# Patient Record
Sex: Female | Born: 1965
Health system: Southern US, Community
[De-identification: ages and names within clinical notes are randomized; demographics above are authoritative.]

## PROBLEM LIST (undated history)

## (undated) DIAGNOSIS — I10 Essential (primary) hypertension: Secondary | ICD-10-CM

## (undated) HISTORY — DX: Essential (primary) hypertension: I10

---

## 1998-06-09 ENCOUNTER — Other Ambulatory Visit: Admission: RE | Admit: 1998-06-09 | Discharge: 1998-06-09 | Payer: Self-pay | Admitting: Gynecology

## 1999-05-12 ENCOUNTER — Other Ambulatory Visit: Admission: RE | Admit: 1999-05-12 | Discharge: 1999-05-12 | Payer: Self-pay | Admitting: Gynecology

## 2000-08-18 ENCOUNTER — Other Ambulatory Visit: Admission: RE | Admit: 2000-08-18 | Discharge: 2000-08-18 | Payer: Self-pay | Admitting: Gynecology

## 2001-11-08 ENCOUNTER — Other Ambulatory Visit: Admission: RE | Admit: 2001-11-08 | Discharge: 2001-11-08 | Payer: Self-pay | Admitting: Gynecology

## 2002-11-27 ENCOUNTER — Other Ambulatory Visit: Admission: RE | Admit: 2002-11-27 | Discharge: 2002-11-27 | Payer: Self-pay | Admitting: Gynecology

## 2003-12-04 ENCOUNTER — Other Ambulatory Visit: Admission: RE | Admit: 2003-12-04 | Discharge: 2003-12-04 | Payer: Self-pay | Admitting: Gynecology

## 2004-12-07 ENCOUNTER — Other Ambulatory Visit: Admission: RE | Admit: 2004-12-07 | Discharge: 2004-12-07 | Payer: Self-pay | Admitting: Gynecology

## 2004-12-17 HISTORY — PX: TUBAL LIGATION: SHX77

## 2004-12-17 HISTORY — PX: PELVIC LAPAROSCOPY: SHX162

## 2005-01-14 ENCOUNTER — Ambulatory Visit (HOSPITAL_BASED_OUTPATIENT_CLINIC_OR_DEPARTMENT_OTHER): Admission: RE | Admit: 2005-01-14 | Discharge: 2005-01-14 | Payer: Self-pay | Admitting: Gynecology

## 2005-01-14 ENCOUNTER — Encounter (INDEPENDENT_AMBULATORY_CARE_PROVIDER_SITE_OTHER): Payer: Self-pay | Admitting: *Deleted

## 2005-01-14 ENCOUNTER — Ambulatory Visit (HOSPITAL_COMMUNITY): Admission: RE | Admit: 2005-01-14 | Discharge: 2005-01-14 | Payer: Self-pay | Admitting: Gynecology

## 2005-12-21 ENCOUNTER — Other Ambulatory Visit: Admission: RE | Admit: 2005-12-21 | Discharge: 2005-12-21 | Payer: Self-pay | Admitting: Gynecology

## 2006-12-23 ENCOUNTER — Other Ambulatory Visit: Admission: RE | Admit: 2006-12-23 | Discharge: 2006-12-23 | Payer: Self-pay | Admitting: Gynecology

## 2008-01-09 ENCOUNTER — Other Ambulatory Visit: Admission: RE | Admit: 2008-01-09 | Discharge: 2008-01-09 | Payer: Self-pay | Admitting: Gynecology

## 2009-01-13 ENCOUNTER — Ambulatory Visit: Payer: Self-pay | Admitting: Gynecology

## 2009-01-13 ENCOUNTER — Other Ambulatory Visit: Admission: RE | Admit: 2009-01-13 | Discharge: 2009-01-13 | Payer: Self-pay | Admitting: Gynecology

## 2009-01-13 ENCOUNTER — Encounter: Payer: Self-pay | Admitting: Gynecology

## 2010-01-30 ENCOUNTER — Other Ambulatory Visit: Admission: RE | Admit: 2010-01-30 | Discharge: 2010-01-30 | Payer: Self-pay | Admitting: Gynecology

## 2010-01-30 ENCOUNTER — Ambulatory Visit: Payer: Self-pay | Admitting: Gynecology

## 2010-12-04 NOTE — H&P (Signed)
NAMEMARAKI, Kelsey Nolan             ACCOUNT NO.:  [id number]   MEDICAL RECORD NO.:  [id number]          PATIENT TYPE:  AMB   LOCATION:  [facility]                         FACILITY:  [facility]   PHYSICIAN:  Timothy P. Fontaine, M.D.DATE OF BIRTH:  [birth year]   DATE OF ADMISSION:  01/14/2005  DATE OF DISCHARGE:                                HISTORY & PHYSICAL   Patient is being admitted on January 14, 2005 at 7:30 at [facility] for day surgery.   CHIEF COMPLAINT:  Increasing dysmenorrhea.  Desires permanent sterilization.   HISTORY OF PRESENT ILLNESS:  A 45 year old G0 presents with a history of  increasing dysmenorrhea.  The patient has a history of being on oral  contraceptives but was unable to tolerate them secondary to side effects and  notes that she has been experiencing increasing dysmenorrhea during her  menses, which is becoming disabling to her.  The patient also desires no  pregnancies and wants to proceed with tubal sterilization.  The patient has  a normal physical exam, and an ultrasound which is normal, showing a normal  uterus, right and left ovaries.  The patient is admitted at this time for  diagnostic laparoscopy and bilateral tubal sterilization.   PAST MEDICAL HISTORY:  Uncomplicated.   PAST SURGICAL HISTORY:  None.   ALLERGIES:  None.   CURRENT MEDICATIONS:  Allegra, multivitamins, and iron.   REVIEW OF SYSTEMS:  Noncontributory.   SOCIAL HISTORY:  Noncontributory.   FAMILY HISTORY:  Noncontributory.   ADMISSION PHYSICAL EXAMINATION:  VITAL SIGNS:  Afebrile.  Vital signs are  stable.  HEENT:  Normal.  LUNGS:  Clear.  CARDIAC:  Regular rate without murmurs, rubs or gallops.  ABDOMEN:  Benign.  PELVIC:  External BUS, vagina normal.  Cervix normal.  Uterus normal size,  midline, and mobile.  Nontender.  Adnexa without masses or tenderness.   ASSESSMENT/PLAN:  A 45 year old G0 with increasing dysmenorrhea, suspicious  for endometriosis.  Normal  physical exam and ultrasound, who also desires  permanent sterilization.  Patient is admitted for a diagnostic laparoscopy  and tubal sterilization.  The risks, benefits, indications, and alternatives  were reviewed with the patient.  The expected intraoperative and  postoperative course and the intraoperative and postoperative risks were all  discussed, understood and accepted.  The pathologic possibilities were  reviewed with the patient to include possible endometriosis, pelvic adhesive  disease, as well as the possibility of a normal pelvis.  She understands  that we may or may not find disease.  I may be able to eradicate all visible  abnormalities, or it may leave her with active disease, all of which she  understands and accepts.  The patient also understands there are no  guarantees as far as pain relief and that her pain may persist, worsen, or  change following the procedure.  I also discussed sterilization with her.  She understands that this is a permanent procedure but also understands this  does have a risk of failure, and she understands and accepts these issues.  Insufflation, trocar placement, use of blunt/sharp dissection,  electrocautery, and laser  were all reviewed with her.  Falope ring  application as well as bipolar cautery sterilization was also discussed with  her.  The risks of infection, both internal, requiring prolonged  antibiotics, as well as incisional, requiring opening and draining of  incisions and closure by secondary intention, was all discussed, understood,  and accepted.  The risk of vascular injury leading to hemorrhage and the  risk of hemorrhage to include transfusion was all discussed, including  transfusion reaction, hepatitis, HIV, mad cow disease, and other unknown  entities.  The risk of inadvertant injury to internal organs, including  bowel, bladder, ureters, vessels, and nerves, necessitating major reparative  surgeries, and future  reparative surgeries, including ostomy formation, was  all discussed, understood, and accepted.  The issues of immediate  recognition as well as delayed recognition was also discussed with her.  The  patient's questions were answered to her satisfaction.  She is ready to  proceed with surgery.       TPF/MEDQ  D:  01/12/2005  T:  01/12/2005  Job:  [id number]

## 2010-12-04 NOTE — Op Note (Signed)
Kelsey Nolan, Kelsey Nolan             ACCOUNT NO.:  [id number]   MEDICAL RECORD NO.:  [id number]          PATIENT TYPE:  AMB   LOCATION:  [facility]                         FACILITY:  [facility]   PHYSICIAN:  Timothy P. Fontaine, M.D.DATE OF BIRTH:  1966-01-19   DATE OF PROCEDURE:  01/14/2005  DATE OF DISCHARGE:                                 OPERATIVE REPORT   PREOPERATIVE DIAGNOSES:  Increasing dysmenorrhea, desires permanent  sterilization.   POSTOPERATIVE DIAGNOSES:  1.  Sterilization, Falope ring technique.  2.  Endometriosis.  3.  Leiomyomata.   PROCEDURE:  Laparoscopic biopsy fulguration endometriosis, bilateral tubal  sterilization, Falope ring technique.   SURGEON:  Dr. Audie Box.   ANESTHETIC:  General.   ESTIMATED BLOOD LOSS:  Minimal.   COMPLICATIONS:  None.   SPECIMEN:  Peritoneal biopsy.   FINDINGS:  Anterior cul-de-sac normal. Posterior cul-de-sac with multiple  superficial endometriotic implants; red, shaggy, powder burn in appearance.  Right uterosacral with classic powder burn implant.  Mid ligament lateral to  uterosacral ligament inferior to ureter:  Small white endometriotic implant.  Right pelvic sidewall otherwise normal. Left pelvic side wall with several  small white endometriotic implants. Uterus normal size, shape and contour.  Two small 5 mm leiomyomata right fundus, subserosal. Right and left  fallopian tubes normal length, caliber fimbriated ends. Single Falope ring  applied to each tube.  Left ovary grossly normal, free and mobile. Right  ovary with several endometriotic implants on the capsule. A 2 cm cyst noted.  Aspiration showed clear cyst fluid consistent with a physiologic cyst. Upper  abdominal exam was grossly normal without evidence of adhesive disease.  Appendix was visualized, normal, free and mobile. Liver edge was visualized  which was normal.  Gallbladder tip was visualized which was normal. They  represented a sample of the endometriosis,  was biopsied and all visible  areas were bipolar cauterized.   PROCEDURE:  The patient was taken to the operating room, underwent general  endotracheal anesthesia, was placed in the low dorsal lithotomy position,  received an abdominal peroneal vaginal preparation with Betadine solution.  The bladder was emptied with in-and-out Foley catheterization. EUA  performed.  The Hulka tenaculum placed through the cervix without  difficulty. The patient was draped in the usual fashion. A vertical  infraumbilical incision was made.  The Veress needle placed, its position  verified with water and approximately 3 liters of carbon dioxide gas were  infused without difficulty. The 10 mm laparoscopic trocar was then placed  without difficulties, its position verified visually. The suprapubic Falope  ring applying trocar was then placed in the midline without difficulty after  transillumination for the vessels under direct visualization. Examination of  the pelvic organs, upper abdominal exam was then carried out with findings  noted above. A representative biopsy of the endometriosis along the right  uterosacral ligament was taken and subsequently all visible areas of  endometriosis were bipolar cauterized either in a brushing technique or  within the grasp of the bipolar forceps. The ureters were identified on both  sides and were away from the cautery sites. Due to the small  cyst on the  right ovary and for fear of an endometrioma, this area was entered and clear  fluid was noted consistent with a physiologic cyst. The pelvis was irrigated  and evacuated noting adequate hemostasis and no remaining areas of active-  appearing endometriosis. Attention was then turned to the tubal  sterilization. The right fallopian tube was identified, traced from its  insertion to its fimbriated end. A mid tubal segment was then drawn into the  Falope ring applier and a single Falope ring was applied. A good knuckle  of  tube was within the ring. Appropriate blanching occurred afterwards and  manipulation assured secure placement. A similar procedure was carried out  on the other side. The suprapubic port was then removed, gas allowed to  escape. All areas re-inspected under low pressure situation showing adequate  hemostasis and the infraumbilical port was backed out under direct  visualization showing adequate hemostasis and no evidence of hernia  formation. A 0 Vicryl infraumbilical subcutaneous fascial stitch was placed.  Both port sites were injected using 0.25% Marcaine and both port sites were  closed using Dermabond skin adhesive. The Hulka tenaculum was removed. The  patient placed in the supine position, awakened without difficulty and taken  to the recovery in good condition having tolerated the procedure well.       TPF/MEDQ  D:  01/14/2005  T:  01/14/2005  Job:  [id number]

## 2011-02-05 ENCOUNTER — Encounter: Payer: Self-pay | Admitting: Gynecology

## 2011-02-18 ENCOUNTER — Encounter: Payer: Self-pay | Admitting: Gynecology

## 2011-02-18 ENCOUNTER — Ambulatory Visit (INDEPENDENT_AMBULATORY_CARE_PROVIDER_SITE_OTHER): Payer: BC Managed Care – PPO | Admitting: Gynecology

## 2011-02-18 ENCOUNTER — Other Ambulatory Visit (HOSPITAL_COMMUNITY)
Admission: RE | Admit: 2011-02-18 | Discharge: 2011-02-18 | Disposition: A | Discharge status: Home / Self Care | Payer: BC Managed Care – PPO | Source: Ambulatory Visit | Attending: Gynecology | Admitting: Gynecology

## 2011-02-18 VITALS — BP 116/70 | Ht 62.75 in | Wt 137.0 lb

## 2011-02-18 DIAGNOSIS — R823 Hemoglobinuria: Secondary | ICD-10-CM

## 2011-02-18 DIAGNOSIS — Z01419 Encounter for gynecological examination (general) (routine) without abnormal findings: Secondary | ICD-10-CM

## 2011-02-18 DIAGNOSIS — Z13220 Encounter for screening for lipoid disorders: Secondary | ICD-10-CM

## 2011-02-18 DIAGNOSIS — Z131 Encounter for screening for diabetes mellitus: Secondary | ICD-10-CM

## 2011-02-18 NOTE — Progress Notes (Signed)
Kelsey Nolan 12-12-1965 [id number]   History:    45 y.o.  for annual exam.  Doing well status post a BTL having regular menses. She does note some abdominal bloating comes and goes and some constipation having bowel movements up to every 3 days. She's tried probiotics it makes her feel a bit better she is no nausea vomiting diarrhea.  Past medical history,surgical history, family history and social history were all reviewed and documented in the EPIC chart. ROS:  Was performed and pertinent positives and negatives are included in the history.  Exam: chaperone present Filed Vitals:   02/18/11 1502  BP: 116/70   General appearance  Normal Skin grossly normal Head/Neck normal with no cervical or supraclavicular adenopathy thyroid normal Lungs  clear Cardiac RR, without RMG Abdominal  soft, nontender, without masses, guarding, rebound or organomegaly  Breasts  examined lying and sitting without masses, retractions, discharge or axillary adenopathy. Pelvic  Ext/BUS/vagina  normal   Cervix  normal Pap done  Uterus  anteverted, normal size, shape and contour, midline and mobile nontender   Adnexa  Without masses or tenderness  Anus and perineum  normal   Rectovaginal  normal sphincter tone without palpated masses or tenderness.   Assessment/Plan:  45 y.o. female for annual exam .  Doing well having some GI type symptoms suggestive of irritable bowel with bloating and irregular bowel movements. She tried probiotics it seemed to help somewhat I asked her to increase her fiber daily since this doesn't help. If her symptoms persist she is to call me on referred to gastroenterology she agrees to do so.   From a GYN standpoint she is doing well regular menses status post BTL. SBE reviewed just had mammo-in June willl continue with annual mammography. CBC glucose lipid profile urinalysis were ordered. Patient will see me year sooner as needed    Dara Lords MD, 3:25 PM 02/18/2011

## 2012-01-17 ENCOUNTER — Other Ambulatory Visit: Payer: Self-pay

## 2012-01-17 DIAGNOSIS — R9230 Dense breasts, unspecified: Secondary | ICD-10-CM

## 2012-01-17 DIAGNOSIS — R922 Inconclusive mammogram: Secondary | ICD-10-CM

## 2012-01-18 ENCOUNTER — Other Ambulatory Visit: Payer: Self-pay | Admitting: Gynecology

## 2012-01-18 DIAGNOSIS — R922 Inconclusive mammogram: Secondary | ICD-10-CM

## 2012-01-21 ENCOUNTER — Encounter: Payer: Self-pay | Admitting: Gynecology

## 2012-02-21 ENCOUNTER — Encounter: Payer: Self-pay | Admitting: Gynecology

## 2012-02-21 ENCOUNTER — Ambulatory Visit (INDEPENDENT_AMBULATORY_CARE_PROVIDER_SITE_OTHER): Payer: BC Managed Care – PPO | Admitting: Gynecology

## 2012-02-21 VITALS — BP 112/70 | Ht 62.5 in | Wt 135.0 lb

## 2012-02-21 DIAGNOSIS — Z01419 Encounter for gynecological examination (general) (routine) without abnormal findings: Secondary | ICD-10-CM

## 2012-02-21 DIAGNOSIS — Z131 Encounter for screening for diabetes mellitus: Secondary | ICD-10-CM

## 2012-02-21 LAB — CBC WITH DIFFERENTIAL/PLATELET
Hemoglobin: 12.9 g/dL (ref 12.0–15.0)
Lymphocytes Relative: 23 % (ref 12–46)
Lymphs Abs: 2.1 10*3/uL (ref 0.7–4.0)
MCV: 89.5 fL (ref 78.0–100.0)
Monocytes Relative: 4 % (ref 3–12)
Neutrophils Relative %: 72 % (ref 43–77)
Platelets: 273 10*3/uL (ref 150–400)
RBC: 4.4 MIL/uL (ref 3.87–5.11)
WBC: 9 10*3/uL (ref 4.0–10.5)

## 2012-02-21 NOTE — Patient Instructions (Signed)
Follow up in one year for annual gynecologic exam. 

## 2012-02-21 NOTE — Progress Notes (Signed)
Kelsey Nolan 1966/01/22 [id number]        46 y.o.  G0P0 for annual exam.  Doing well no complaints.  Past medical history,surgical history, medications, allergies, family history and social history were all reviewed and documented in the EPIC chart. ROS:  Was performed and pertinent positives and negatives are included in the history.  Exam: Sherrilyn Rist assistant Filed Vitals:   02/21/12 1503  BP: 112/70  Height: 5' 2.5" (1.588 m)  Weight: 135 lb (61.236 kg)   General appearance  Normal Skin grossly normal Head/Neck normal with no cervical or supraclavicular adenopathy thyroid normal Lungs  clear Cardiac RR, without RMG Abdominal  soft, nontender, without masses, organomegaly or hernia Breasts  examined lying and sitting without masses, retractions, discharge or axillary adenopathy. Pelvic  Ext/BUS/vagina  normal   Cervix  normal   Uterus  anteverted, normal size, shape and contour, midline and mobile nontender   Adnexa  Without masses or tenderness    Anus and perineum  normal   Rectovaginal  normal sphincter tone without palpated masses or tenderness.    Assessment/Plan:  46 y.o. G0P0 female for annual exam, BTL, regular menses.   1. Mammography. Mammogram July 2013. We'll continue with annual mammography. SBE monthly reviewed. 2. Pap smear. Pap 2012 normal. No Pap smear done today. No history of abnormal Pap smears. Plan every 3-5 year screening per current screening guidelines. 3. Health maintenance. Lipid profile last year normal. We'll check CBC glucose urinalysis. Follow up in one year, sooner as needed.    Dara Lords MD, 3:28 PM 02/21/2012

## 2012-02-22 LAB — URINALYSIS W MICROSCOPIC + REFLEX CULTURE
Casts: NONE SEEN
Crystals: NONE SEEN
Leukocytes, UA: NEGATIVE
Nitrite: NEGATIVE
Specific Gravity, Urine: 1.008 (ref 1.005–1.030)
Squamous Epithelial / LPF: NONE SEEN
Urobilinogen, UA: 0.2 mg/dL (ref 0.0–1.0)
pH: 5.5 (ref 5.0–8.0)

## 2012-02-22 LAB — GLUCOSE, RANDOM: Glucose, Bld: 96 mg/dL (ref 70–99)

## 2013-01-29 ENCOUNTER — Encounter: Payer: Self-pay | Admitting: Gynecology

## 2013-02-27 ENCOUNTER — Ambulatory Visit (INDEPENDENT_AMBULATORY_CARE_PROVIDER_SITE_OTHER): Payer: BC Managed Care – PPO | Admitting: Gynecology

## 2013-02-27 ENCOUNTER — Encounter: Payer: Self-pay | Admitting: Gynecology

## 2013-02-27 VITALS — BP 122/76 | Ht 62.0 in | Wt 137.0 lb

## 2013-02-27 DIAGNOSIS — Z01419 Encounter for gynecological examination (general) (routine) without abnormal findings: Secondary | ICD-10-CM

## 2013-02-27 DIAGNOSIS — Z13220 Encounter for screening for lipoid disorders: Secondary | ICD-10-CM

## 2013-02-27 LAB — CBC WITH DIFFERENTIAL/PLATELET
Basophils Relative: 0 % (ref 0–1)
Eosinophils Absolute: 0.1 10*3/uL (ref 0.0–0.7)
Eosinophils Relative: 1 % (ref 0–5)
HCT: 37.6 % (ref 36.0–46.0)
Hemoglobin: 12.9 g/dL (ref 12.0–15.0)
MCH: 29.8 pg (ref 26.0–34.0)
MCHC: 34.3 g/dL (ref 30.0–36.0)
MCV: 86.8 fL (ref 78.0–100.0)
Monocytes Absolute: 0.5 10*3/uL (ref 0.1–1.0)
Monocytes Relative: 7 % (ref 3–12)
Neutro Abs: 4.2 10*3/uL (ref 1.7–7.7)

## 2013-02-27 LAB — COMPREHENSIVE METABOLIC PANEL
Albumin: 4.7 g/dL (ref 3.5–5.2)
Alkaline Phosphatase: 37 U/L — ABNORMAL LOW (ref 39–117)
BUN: 12 mg/dL (ref 6–23)
Creat: 0.93 mg/dL (ref 0.50–1.10)
Glucose, Bld: 84 mg/dL (ref 70–99)
Potassium: 4 mEq/L (ref 3.5–5.3)

## 2013-02-27 LAB — LIPID PANEL
HDL: 48 mg/dL (ref >39)
LDL Cholesterol: 106 mg/dL — ABNORMAL HIGH (ref 0–99)
Triglycerides: 121 mg/dL (ref <150)

## 2013-02-27 NOTE — Progress Notes (Signed)
Kelsey Nolan 09-15-1965 [id number]        47 y.o.  G0P0 for annual exam.  Doing well without complaints.  Past medical history,surgical history, medications, allergies, family history and social history were all reviewed and documented in the EPIC chart.  ROS:  Performed and pertinent positives and negatives are included in the history, assessment and plan .  Exam: [facility] assistant Filed Vitals:   02/27/13 1544  BP: 122/76  Height: 5\' 2"  (1.575 m)  Weight: 137 lb (62.143 kg)   General appearance  Normal Skin grossly normal Head/Neck normal with no cervical or supraclavicular adenopathy thyroid normal Lungs  clear Cardiac RR, without RMG Abdominal  soft, nontender, without masses, organomegaly or hernia Breasts  examined lying and sitting without masses, retractions, discharge or axillary adenopathy. Pelvic  Ext/BUS/vagina  normal   Cervix  normal   Uterus  anteverted, normal size, shape and contour, midline and mobile nontender   Adnexa  Without masses or tenderness    Anus and perineum  normal   Rectovaginal  normal sphincter tone without palpated masses or tenderness.    Assessment/Plan:  47 y.o. G0P0 female for annual exam, regular menses, tubal sterilization.   1. Pap smear 2012. No Pap smear done today. No history of abnormal Pap smears previously. Plan repeat Pap smear next year 3 year interval. 2. Mammography 01/2013. Continued annual mammography. SBE monthly reviewed. 3. Health maintenance. Baseline CBC comprehensive metabolic panel lipid profile urinalysis ordered. Followup one year, sooner as needed.  Note: This document was prepared with digital dictation and possible smart phrase technology. Any transcriptional errors that result from this process are unintentional.   Dara Lords MD, 4:08 PM 02/27/2013

## 2013-02-27 NOTE — Patient Instructions (Signed)
followup in one year, sooner as needed. 

## 2013-02-28 LAB — URINALYSIS W MICROSCOPIC + REFLEX CULTURE
Bacteria, UA: NONE SEEN
Casts: NONE SEEN
Glucose, UA: NEGATIVE mg/dL
Hgb urine dipstick: NEGATIVE
Ketones, ur: NEGATIVE mg/dL
Nitrite: NEGATIVE
pH: 7.5 (ref 5.0–8.0)

## 2013-05-24 ENCOUNTER — Other Ambulatory Visit: Payer: Self-pay

## 2014-02-01 ENCOUNTER — Encounter: Payer: Self-pay | Admitting: Gynecology

## 2014-03-04 ENCOUNTER — Encounter: Payer: BC Managed Care – PPO | Admitting: Gynecology

## 2014-03-06 ENCOUNTER — Encounter: Payer: Self-pay | Admitting: Gynecology

## 2014-03-06 ENCOUNTER — Other Ambulatory Visit (HOSPITAL_COMMUNITY)
Admission: RE | Admit: 2014-03-06 | Discharge: 2014-03-06 | Disposition: A | Discharge status: Home / Self Care | Payer: BC Managed Care – PPO | Source: Ambulatory Visit | Attending: Gynecology | Admitting: Gynecology

## 2014-03-06 ENCOUNTER — Ambulatory Visit (INDEPENDENT_AMBULATORY_CARE_PROVIDER_SITE_OTHER): Payer: BC Managed Care – PPO | Admitting: Gynecology

## 2014-03-06 VITALS — BP 112/64 | Ht 62.0 in | Wt 136.0 lb

## 2014-03-06 DIAGNOSIS — Z1151 Encounter for screening for human papillomavirus (HPV): Secondary | ICD-10-CM | POA: Insufficient documentation

## 2014-03-06 DIAGNOSIS — Z01419 Encounter for gynecological examination (general) (routine) without abnormal findings: Secondary | ICD-10-CM | POA: Diagnosis present

## 2014-03-06 LAB — CBC WITH DIFFERENTIAL/PLATELET
BASOS PCT: 1 % (ref 0–1)
Basophils Absolute: 0.1 10*3/uL (ref 0.0–0.1)
EOS ABS: 0.1 10*3/uL (ref 0.0–0.7)
EOS PCT: 1 % (ref 0–5)
HEMATOCRIT: 37.8 % (ref 36.0–46.0)
HEMOGLOBIN: 13 g/dL (ref 12.0–15.0)
LYMPHS ABS: 2.1 10*3/uL (ref 0.7–4.0)
Lymphocytes Relative: 25 % (ref 12–46)
MCH: 30.2 pg (ref 26.0–34.0)
MCHC: 34.4 g/dL (ref 30.0–36.0)
MCV: 87.9 fL (ref 78.0–100.0)
MONO ABS: 0.6 10*3/uL (ref 0.1–1.0)
MONOS PCT: 7 % (ref 3–12)
NEUTROS PCT: 66 % (ref 43–77)
Neutro Abs: 5.5 10*3/uL (ref 1.7–7.7)
Platelets: 286 10*3/uL (ref 150–400)
RBC: 4.3 MIL/uL (ref 3.87–5.11)
RDW: 13.6 % (ref 11.5–15.5)
WBC: 8.4 10*3/uL (ref 4.0–10.5)

## 2014-03-06 NOTE — Progress Notes (Signed)
Kelsey BusmanKaren Nolan [birth year] [id number]        48 y.o.  G0P0 for annual exam.  Doing well without complaints.  Past medical history,surgical history, problem list, medications, allergies, family history and social history were all reviewed and documented as reviewed in the EPIC chart.  ROS:  12 system ROS performed with pertinent positives and negatives included in the history, assessment and plan.   Additional significant findings :  None   Exam: [facility] personassistant Filed Vitals:   03/06/14 1551  BP: 112/64  Height: 5\' 2"  (1.575 m)  Weight: 136 lb (61.689 kg)   General appearance:  Normal affect, orientation and appearance. Skin: Grossly normal HEENT: Without gross lesions.  No cervical or supraclavicular adenopathy. Thyroid normal.  Lungs:  Clear without wheezing, rales or rhonchi Cardiac: RR, without RMG Abdominal:  Soft, nontender, without masses, guarding, rebound, organomegaly or hernia Breasts:  Examined lying and sitting without masses, retractions, discharge or axillary adenopathy. Pelvic:  Ext/BUS/vagina normal  Cervix normal. Pap/HPV  Uterus anteverted, normal size, shape and contour, midline and mobile nontender   Adnexa  Without masses or tenderness    Anus and perineum  Normal   Rectovaginal  Normal sphincter tone without palpated masses or tenderness.    Assessment/Plan:  48 y.o. G0P0 female for annual exam with regular menses, tubal sterilization.   1. Pap smear 2012. Pap/HPV done today. No history of abnormal Pap smears previously. Plan followup Pap smear in 3-5 your interval per current screening guidelines assuming this Pap smear is normal. 2. 3-D Mammography 01/2014. Both the patient's mother and maternal grandmother had postmenopausal breast cancer. No other family members with breast cancer or ovarian cancer. Discussed possible genetic linkage. Her mother is alive and I asked her to have her mother asked about genetic testing with her oncologist and whether they  would recommend this and she agrees to discuss this with her. I reviewed that it would be best to have the patient with the cancer tested. Will continue with annual mammography. SBE monthly reviewed. 3. Health maintenance. Baseline CBC comprehensive metabolic panel lipid profile urinalysis ordered. Followup in one year, sooner as needed.   Note: This document was prepared with digital dictation and possible smart phrase technology. Any transcriptional errors that result from this process are unintentional.   Dara LordsFONTAINE,Kelsey Little P MD, 4:39 PM 03/06/2014

## 2014-03-07 ENCOUNTER — Other Ambulatory Visit: Payer: Self-pay | Admitting: Gynecology

## 2014-03-07 DIAGNOSIS — R7309 Other abnormal glucose: Secondary | ICD-10-CM

## 2014-03-07 LAB — COMPREHENSIVE METABOLIC PANEL
ALBUMIN: 4.6 g/dL (ref 3.5–5.2)
ALT: 16 U/L (ref 0–35)
AST: 17 U/L (ref 0–37)
Alkaline Phosphatase: 37 U/L — ABNORMAL LOW (ref 39–117)
BILIRUBIN TOTAL: 0.5 mg/dL (ref 0.2–1.2)
BUN: 9 mg/dL (ref 6–23)
CALCIUM: 9.2 mg/dL (ref 8.4–10.5)
CO2: 28 mEq/L (ref 19–32)
CREATININE: 0.78 mg/dL (ref 0.50–1.10)
Chloride: 100 mEq/L (ref 96–112)
Glucose, Bld: 108 mg/dL — ABNORMAL HIGH (ref 70–99)
Potassium: 3.8 mEq/L (ref 3.5–5.3)
Sodium: 138 mEq/L (ref 135–145)
TOTAL PROTEIN: 7 g/dL (ref 6.0–8.3)

## 2014-03-07 LAB — URINALYSIS W MICROSCOPIC + REFLEX CULTURE
BACTERIA UA: NONE SEEN
BILIRUBIN URINE: NEGATIVE
CASTS: NONE SEEN
CRYSTALS: NONE SEEN
GLUCOSE, UA: NEGATIVE mg/dL
Hgb urine dipstick: NEGATIVE
KETONES UR: NEGATIVE mg/dL
Leukocytes, UA: NEGATIVE
Nitrite: NEGATIVE
Protein, ur: NEGATIVE mg/dL
Squamous Epithelial / LPF: NONE SEEN
Urobilinogen, UA: 0.2 mg/dL (ref 0.0–1.0)
pH: 7 (ref 5.0–8.0)

## 2014-03-07 LAB — LIPID PANEL
CHOL/HDL RATIO: 3.3 ratio
CHOLESTEROL: 178 mg/dL (ref 0–200)
HDL: 54 mg/dL (ref >39)
LDL CALC: 84 mg/dL (ref 0–99)
Triglycerides: 201 mg/dL — ABNORMAL HIGH (ref <150)
VLDL: 40 mg/dL (ref 0–40)

## 2014-03-08 LAB — CYTOLOGY - PAP

## 2014-03-11 ENCOUNTER — Other Ambulatory Visit: Payer: BC Managed Care – PPO

## 2014-03-11 DIAGNOSIS — R7309 Other abnormal glucose: Secondary | ICD-10-CM

## 2014-03-11 LAB — GLUCOSE, FASTING: GLUCOSE, FASTING: 114 mg/dL — AB (ref 70–99)

## 2014-03-12 ENCOUNTER — Other Ambulatory Visit: Payer: Self-pay | Admitting: Gynecology

## 2014-03-12 DIAGNOSIS — R7309 Other abnormal glucose: Secondary | ICD-10-CM

## 2014-07-29 ENCOUNTER — Other Ambulatory Visit: Payer: BLUE CROSS/BLUE SHIELD

## 2014-07-29 DIAGNOSIS — R7309 Other abnormal glucose: Secondary | ICD-10-CM

## 2014-07-29 LAB — HEMOGLOBIN A1C
HEMOGLOBIN A1C: 5.8 % — AB (ref <5.7)
Mean Plasma Glucose: 120 mg/dL — ABNORMAL HIGH (ref <117)

## 2014-07-30 ENCOUNTER — Other Ambulatory Visit: Payer: Self-pay | Admitting: Gynecology

## 2014-07-30 DIAGNOSIS — R7309 Other abnormal glucose: Secondary | ICD-10-CM

## 2014-07-30 LAB — GLUCOSE, FASTING: GLUCOSE, FASTING: 100 mg/dL — AB (ref 70–99)

## 2015-02-06 ENCOUNTER — Encounter: Payer: Self-pay | Admitting: Gynecology

## 2015-03-20 ENCOUNTER — Ambulatory Visit (INDEPENDENT_AMBULATORY_CARE_PROVIDER_SITE_OTHER): Payer: BLUE CROSS/BLUE SHIELD | Admitting: Gynecology

## 2015-03-20 ENCOUNTER — Encounter: Payer: Self-pay | Admitting: Gynecology

## 2015-03-20 VITALS — BP 120/76 | Ht 62.0 in | Wt 115.0 lb

## 2015-03-20 DIAGNOSIS — Z01419 Encounter for gynecological examination (general) (routine) without abnormal findings: Secondary | ICD-10-CM

## 2015-03-20 LAB — CBC WITH DIFFERENTIAL/PLATELET
Basophils Absolute: 0.1 10*3/uL (ref 0.0–0.1)
Basophils Relative: 1 % (ref 0–1)
EOS PCT: 1 % (ref 0–5)
Eosinophils Absolute: 0.1 10*3/uL (ref 0.0–0.7)
HCT: 36.6 % (ref 36.0–46.0)
Hemoglobin: 12.4 g/dL (ref 12.0–15.0)
LYMPHS PCT: 29 % (ref 12–46)
Lymphs Abs: 1.8 10*3/uL (ref 0.7–4.0)
MCH: 30.6 pg (ref 26.0–34.0)
MCHC: 33.9 g/dL (ref 30.0–36.0)
MCV: 90.4 fL (ref 78.0–100.0)
MPV: 9.3 fL (ref 8.6–12.4)
Monocytes Absolute: 0.4 10*3/uL (ref 0.1–1.0)
Monocytes Relative: 6 % (ref 3–12)
NEUTROS PCT: 63 % (ref 43–77)
Neutro Abs: 4 10*3/uL (ref 1.7–7.7)
PLATELETS: 251 10*3/uL (ref 150–400)
RBC: 4.05 MIL/uL (ref 3.87–5.11)
RDW: 13.5 % (ref 11.5–15.5)
WBC: 6.3 10*3/uL (ref 4.0–10.5)

## 2015-03-20 LAB — LIPID PANEL
Cholesterol: 178 mg/dL (ref 125–200)
HDL: 60 mg/dL (ref >=46)
LDL Cholesterol: 103 mg/dL (ref <130)
Total CHOL/HDL Ratio: 3 Ratio (ref <=5.0)
Triglycerides: 76 mg/dL (ref <150)
VLDL: 15 mg/dL (ref <30)

## 2015-03-20 LAB — COMPREHENSIVE METABOLIC PANEL
ALK PHOS: 30 U/L — AB (ref 33–115)
ALT: 22 U/L (ref 6–29)
AST: 22 U/L (ref 10–35)
Albumin: 4.7 g/dL (ref 3.6–5.1)
BUN: 19 mg/dL (ref 7–25)
CO2: 31 mmol/L (ref 20–31)
Calcium: 9.1 mg/dL (ref 8.6–10.2)
Chloride: 99 mmol/L (ref 98–110)
Creat: 1.02 mg/dL (ref 0.50–1.10)
GLUCOSE: 79 mg/dL (ref 65–99)
Potassium: 3.8 mmol/L (ref 3.5–5.3)
Sodium: 140 mmol/L (ref 135–146)
TOTAL PROTEIN: 7 g/dL (ref 6.1–8.1)
Total Bilirubin: 0.4 mg/dL (ref 0.2–1.2)

## 2015-03-20 LAB — TSH: TSH: 4.071 u[IU]/mL (ref 0.350–4.500)

## 2015-03-20 NOTE — Progress Notes (Signed)
Kelsey Nolan Feb 03, 1966 [id number]        49 y.o.  G0P0 for annual exam. Doing well without complaints.  Past medical history,surgical history, problem list, medications, allergies, family history and social history were all reviewed and documented as reviewed in the EPIC chart.  ROS:  Performed with pertinent positives and negatives included in the history, assessment and plan.   Additional significant findings :  none   Exam: [facility] person Vitals:   03/20/15 1524  BP: 120/76  Height:  (1.575 m)  Weight: 115 lb (52.164 kg)   General appearance:  Normal affect, orientation and appearance. Skin: Grossly normal HEENT: Without gross lesions.  No cervical or supraclavicular adenopathy. Thyroid normal.  Lungs:  Clear without wheezing, rales or rhonchi Cardiac: RR, without RMG Abdominal:  Soft, nontender, without masses, guarding, rebound, organomegaly or hernia Breasts:  Examined lying and sitting without masses, retractions, discharge or axillary adenopathy. Pelvic:  Ext/BUS/vagina normal  Cervix normal  Uterus anteverted, normal size, shape and contour, midline and mobile nontender   Adnexa  Without masses or tenderness    Anus and perineum  Normal   Rectovaginal  Normal sphincter tone without palpated masses or tenderness.    Assessment/Plan:  49 y.o. G0P0 female for annual exam with regular menses, tubal sterilization.   1. Pap smear/HPV negative 02/2014. No Pap smear done today. No history of significant abnormal Pap smears. Plan repeat Pap smear in 3-5 year interval. 2. Mammography 01/2015. Continue with annual mammography. SBE monthly reviewed. 3. Health maintenance. Baseline CBC comprehensive metabolic panel lipid profile urinalysis TSH vitamin D ordered.  Follow up in one year, sooner as needed   Dara Lords MD, 3:55 PM 03/20/2015

## 2015-03-20 NOTE — Patient Instructions (Signed)

## 2015-03-21 LAB — URINALYSIS W MICROSCOPIC + REFLEX CULTURE
Bacteria, UA: NONE SEEN [HPF]
Bilirubin Urine: NEGATIVE
CRYSTALS: NONE SEEN [HPF]
Casts: NONE SEEN [LPF]
Glucose, UA: NEGATIVE
Hgb urine dipstick: NEGATIVE
KETONES UR: NEGATIVE
Leukocytes, UA: NEGATIVE
Nitrite: NEGATIVE
Protein, ur: NEGATIVE
RBC / HPF: NONE SEEN RBC/HPF (ref <=2)
Specific Gravity, Urine: 1.004 (ref 1.001–1.035)
Squamous Epithelial / LPF: NONE SEEN [HPF] (ref <=5)
WBC, UA: NONE SEEN WBC/HPF (ref <=5)
YEAST: NONE SEEN [HPF]
pH: 6.5 (ref 5.0–8.0)

## 2015-03-21 LAB — VITAMIN D 25 HYDROXY (VIT D DEFICIENCY, FRACTURES): VIT D 25 HYDROXY: 48 ng/mL (ref 30–100)

## 2015-11-18 DIAGNOSIS — Z0000 Encounter for general adult medical examination without abnormal findings: Secondary | ICD-10-CM | POA: Diagnosis not present

## 2015-11-18 DIAGNOSIS — Z13220 Encounter for screening for lipoid disorders: Secondary | ICD-10-CM | POA: Diagnosis not present

## 2015-12-23 DIAGNOSIS — D2261 Melanocytic nevi of right upper limb, including shoulder: Secondary | ICD-10-CM | POA: Diagnosis not present

## 2015-12-23 DIAGNOSIS — D225 Melanocytic nevi of trunk: Secondary | ICD-10-CM | POA: Diagnosis not present

## 2016-01-05 ENCOUNTER — Other Ambulatory Visit (HOSPITAL_COMMUNITY): Payer: Self-pay | Admitting: Gastroenterology

## 2016-01-05 ENCOUNTER — Ambulatory Visit (HOSPITAL_COMMUNITY)
Admission: RE | Admit: 2016-01-05 | Discharge: 2016-01-05 | Disposition: A | Discharge status: Home / Self Care | Payer: BLUE CROSS/BLUE SHIELD | Source: Ambulatory Visit | Attending: Gastroenterology | Admitting: Gastroenterology

## 2016-01-05 DIAGNOSIS — R140 Abdominal distension (gaseous): Secondary | ICD-10-CM | POA: Diagnosis not present

## 2016-01-05 DIAGNOSIS — Z1211 Encounter for screening for malignant neoplasm of colon: Secondary | ICD-10-CM

## 2016-01-05 DIAGNOSIS — Q438 Other specified congenital malformations of intestine: Secondary | ICD-10-CM | POA: Diagnosis not present

## 2016-01-05 DIAGNOSIS — Z538 Procedure and treatment not carried out for other reasons: Secondary | ICD-10-CM | POA: Diagnosis not present

## 2016-01-09 DIAGNOSIS — H04123 Dry eye syndrome of bilateral lacrimal glands: Secondary | ICD-10-CM | POA: Diagnosis not present

## 2016-01-09 DIAGNOSIS — H5213 Myopia, bilateral: Secondary | ICD-10-CM | POA: Diagnosis not present

## 2016-02-06 DIAGNOSIS — Z803 Family history of malignant neoplasm of breast: Secondary | ICD-10-CM | POA: Diagnosis not present

## 2016-02-06 DIAGNOSIS — Z1231 Encounter for screening mammogram for malignant neoplasm of breast: Secondary | ICD-10-CM | POA: Diagnosis not present

## 2016-03-01 ENCOUNTER — Encounter: Payer: Self-pay | Admitting: Gynecology

## 2016-04-01 ENCOUNTER — Encounter: Payer: BLUE CROSS/BLUE SHIELD | Admitting: Gynecology

## 2016-04-23 ENCOUNTER — Encounter: Payer: BLUE CROSS/BLUE SHIELD | Admitting: Gynecology

## 2016-06-02 ENCOUNTER — Encounter: Payer: Self-pay | Admitting: Gynecology

## 2016-06-02 ENCOUNTER — Ambulatory Visit (INDEPENDENT_AMBULATORY_CARE_PROVIDER_SITE_OTHER): Payer: BLUE CROSS/BLUE SHIELD | Admitting: Gynecology

## 2016-06-02 VITALS — BP 118/76 | Ht 63.0 in | Wt 119.0 lb

## 2016-06-02 DIAGNOSIS — Z01419 Encounter for gynecological examination (general) (routine) without abnormal findings: Secondary | ICD-10-CM | POA: Diagnosis not present

## 2016-06-02 DIAGNOSIS — N951 Menopausal and female climacteric states: Secondary | ICD-10-CM | POA: Diagnosis not present

## 2016-06-02 DIAGNOSIS — I471 Supraventricular tachycardia: Secondary | ICD-10-CM | POA: Diagnosis not present

## 2016-06-02 DIAGNOSIS — N926 Irregular menstruation, unspecified: Secondary | ICD-10-CM

## 2016-06-02 LAB — TSH: TSH: 3.32 mIU/L

## 2016-06-02 NOTE — Patient Instructions (Signed)
Try the maneuvers we discussed if your heart starts racing. If this becomes a more common issues then we will send you to see a cardiologist.  You may obtain a copy of any labs that were done today by logging onto MyChart as outlined in the instructions provided with your AVS (after visit summary). The office will not call with normal lab results but certainly if there are any significant abnormalities then we will contact you.   Health Maintenance Adopting a healthy lifestyle and getting preventive care can go a long way to promote health and wellness. Talk with your health care provider about what schedule of regular examinations is right for you. This is a good chance for you to check in with your provider about disease prevention and staying healthy. In between checkups, there are plenty of things you can do on your own. Experts have done a lot of research about which lifestyle changes and preventive measures are most likely to keep you healthy. Ask your health care provider for more information. WEIGHT AND DIET  Eat a healthy diet  Be sure to include plenty of vegetables, fruits, low-fat dairy products, and lean protein.  Do not eat a lot of foods high in solid fats, added sugars, or salt.  Get regular exercise. This is one of the most important things you can do for your health.  Most adults should exercise for at least 150 minutes each week. The exercise should increase your heart rate and make you sweat (moderate-intensity exercise).  Most adults should also do strengthening exercises at least twice a week. This is in addition to the moderate-intensity exercise.  Maintain a healthy weight  Body mass index (BMI) is a measurement that can be used to identify possible weight problems. It estimates body fat based on height and weight. Your health care provider can help determine your BMI and help you achieve or maintain a healthy weight.  For females 20 years of age and older:   A BMI  below 18.5 is considered underweight.  A BMI of 18.5 to 24.9 is normal.  A BMI of 25 to 29.9 is considered overweight.  A BMI of 30 and above is considered obese.  Watch levels of cholesterol and blood lipids  You should start having your blood tested for lipids and cholesterol at 51 years of age, then have this test every 5 years.  You may need to have your cholesterol levels checked more often if:  Your lipid or cholesterol levels are high.  You are older than 50 years of age.  You are at high risk for heart disease.  CANCER SCREENING   Lung Cancer  Lung cancer screening is recommended for adults 55-80 years old who are at high risk for lung cancer because of a history of smoking.  A yearly low-dose CT scan of the lungs is recommended for people who:  Currently smoke.  Have quit within the past 15 years.  Have at least a 30-pack-year history of smoking. A pack year is smoking an average of one pack of cigarettes a day for 1 year.  Yearly screening should continue until it has been 15 years since you quit.  Yearly screening should stop if you develop a health problem that would prevent you from having lung cancer treatment.  Breast Cancer  Practice breast self-awareness. This means understanding how your breasts normally appear and feel.  It also means doing regular breast self-exams. Let your health care provider know about any changes, no   matter how small.  If you are in your 20s or 30s, you should have a clinical breast exam (CBE) by a health care provider every 1-3 years as part of a regular health exam.  If you are 22 or older, have a CBE every year. Also consider having a breast X-ray (mammogram) every year.  If you have a family history of breast cancer, talk to your health care provider about genetic screening.  If you are at high risk for breast cancer, talk to your health care provider about having an MRI and a mammogram every year.  Breast cancer gene  (BRCA) assessment is recommended for women who have family members with BRCA-related cancers. BRCA-related cancers include:  Breast.  Ovarian.  Tubal.  Peritoneal cancers.  Results of the assessment will determine the need for genetic counseling and BRCA1 and BRCA2 testing. Cervical Cancer Routine pelvic examinations to screen for cervical cancer are no longer recommended for nonpregnant women who are considered low risk for cancer of the pelvic organs (ovaries, uterus, and vagina) and who do not have symptoms. A pelvic examination may be necessary if you have symptoms including those associated with pelvic infections. Ask your health care provider if a screening pelvic exam is right for you.   The Pap test is the screening test for cervical cancer for women who are considered at risk.  If you had a hysterectomy for a problem that was not cancer or a condition that could lead to cancer, then you no longer need Pap tests.  If you are older than 65 years, and you have had normal Pap tests for the past 10 years, you no longer need to have Pap tests.  If you have had past treatment for cervical cancer or a condition that could lead to cancer, you need Pap tests and screening for cancer for at least 20 years after your treatment.  If you no longer get a Pap test, assess your risk factors if they change (such as having a new sexual partner). This can affect whether you should start being screened again.  Some women have medical problems that increase their chance of getting cervical cancer. If this is the case for you, your health care provider may recommend more frequent screening and Pap tests.  The human papillomavirus (HPV) test is another test that may be used for cervical cancer screening. The HPV test looks for the virus that can cause cell changes in the cervix. The cells collected during the Pap test can be tested for HPV.  The HPV test can be used to screen women 50 years of age and  older. Getting tested for HPV can extend the interval between normal Pap tests from three to five years.  An HPV test also should be used to screen women of any age who have unclear Pap test results.  After 51 years of age, women should have HPV testing as often as Pap tests.  Colorectal Cancer  This type of cancer can be detected and often prevented.  Routine colorectal cancer screening usually begins at 51 years of age and continues through 51 years of age.  Your health care provider may recommend screening at an earlier age if you have risk factors for colon cancer.  Your health care provider may also recommend using home test kits to check for hidden blood in the stool.  A small camera at the end of a tube can be used to examine your colon directly (sigmoidoscopy or colonoscopy). This is  done to check for the earliest forms of colorectal cancer.  Routine screening usually begins at age 64.  Direct examination of the colon should be repeated every 5-10 years through 51 years of age. However, you may need to be screened more often if early forms of precancerous polyps or small growths are found. Skin Cancer  Check your skin from head to toe regularly.  Tell your health care provider about any new moles or changes in moles, especially if there is a change in a mole's shape or color.  Also tell your health care provider if you have a mole that is larger than the size of a pencil eraser.  Always use sunscreen. Apply sunscreen liberally and repeatedly throughout the day.  Protect yourself by wearing long sleeves, pants, a wide-brimmed hat, and sunglasses whenever you are outside. HEART DISEASE, DIABETES, AND HIGH BLOOD PRESSURE   Have your blood pressure checked at least every 1-2 years. High blood pressure causes heart disease and increases the risk of stroke.  If you are between 57 years and 75 years old, ask your health care provider if you should take aspirin to prevent  strokes.  Have regular diabetes screenings. This involves taking a blood sample to check your fasting blood sugar level.  If you are at a normal weight and have a low risk for diabetes, have this test once every three years after 51 years of age.  If you are overweight and have a high risk for diabetes, consider being tested at a younger age or more often. PREVENTING INFECTION  Hepatitis B  If you have a higher risk for hepatitis B, you should be screened for this virus. You are considered at high risk for hepatitis B if:  You were born in a country where hepatitis B is common. Ask your health care provider which countries are considered high risk.  Your parents were born in a high-risk country, and you have not been immunized against hepatitis B (hepatitis B vaccine).  You have HIV or AIDS.  You use needles to inject street drugs.  You live with someone who has hepatitis B.  You have had sex with someone who has hepatitis B.  You get hemodialysis treatment.  You take certain medicines for conditions, including cancer, organ transplantation, and autoimmune conditions. Hepatitis C  Blood testing is recommended for:  Everyone born from [birth year] through 1966.  Anyone with known risk factors for hepatitis C. Sexually transmitted infections (STIs)  You should be screened for sexually transmitted infections (STIs) including gonorrhea and chlamydia if:  You are sexually active and are younger than 51 years of age.  You are older than 51 years of age and your health care provider tells you that you are at risk for this type of infection.  Your sexual activity has changed since you were last screened and you are at an increased risk for chlamydia or gonorrhea. Ask your health care provider if you are at risk.  If you do not have HIV, but are at risk, it may be recommended that you take a prescription medicine daily to prevent HIV infection. This is called pre-exposure prophylaxis  (PrEP). You are considered at risk if:  You are sexually active and do not regularly use condoms or know the HIV status of your partner(s).  You take drugs by injection.  You are sexually active with a partner who has HIV. Talk with your health care provider about whether you are at high risk of being infected  with HIV. If you choose to begin PrEP, you should first be tested for HIV. You should then be tested every 3 months for as long as you are taking PrEP.  PREGNANCY   If you are premenopausal and you may become pregnant, ask your health care provider about preconception counseling.  If you may become pregnant, take 400 to 800 micrograms (mcg) of folic acid every day.  If you want to prevent pregnancy, talk to your health care provider about birth control (contraception). OSTEOPOROSIS AND MENOPAUSE   Osteoporosis is a disease in which the bones lose minerals and strength with aging. This can result in serious bone fractures. Your risk for osteoporosis can be identified using a bone density scan.  If you are 52 years of age or older, or if you are at risk for osteoporosis and fractures, ask your health care provider if you should be screened.  Ask your health care provider whether you should take a calcium or vitamin D supplement to lower your risk for osteoporosis.  Menopause may have certain physical symptoms and risks.  Hormone replacement therapy may reduce some of these symptoms and risks. Talk to your health care provider about whether hormone replacement therapy is right for you.  HOME CARE INSTRUCTIONS   Schedule regular health, dental, and eye exams.  Stay current with your immunizations.   Do not use any tobacco products including cigarettes, chewing tobacco, or electronic cigarettes.  If you are pregnant, do not drink alcohol.  If you are breastfeeding, limit how much and how often you drink alcohol.  Limit alcohol intake to no more than 1 drink per day for  nonpregnant women. One drink equals 12 ounces of beer, 5 ounces of wine, or 1 ounces of hard liquor.  Do not use street drugs.  Do not share needles.  Ask your health care provider for help if you need support or information about quitting drugs.  Tell your health care provider if you often feel depressed.  Tell your health care provider if you have ever been abused or do not feel safe at home. Document Released: 01/18/2011 Document Revised: 11/19/2013 Document Reviewed: 06/06/2013 [facility] Patient Information 2015 [place], [place]. This information is not intended to replace advice given to you by your health care provider. Make sure you discuss any questions you have with your health care provider.

## 2016-06-02 NOTE — Progress Notes (Signed)
Kelsey BusmanKaren Kulish 03-Nov-1965 [id number]        50 y.o.  G0P0  for annual exam.  Several issues noted below.  Past medical history,surgical history, problem list, medications, allergies, family history and social history were all reviewed and documented as reviewed in the EPIC chart.  ROS:  Performed with pertinent positives and negatives included in the history, assessment and plan.   Additional significant findings :  None   Exam: Kennon PortelaKim Gardner assistant Vitals:   06/02/16 1023  BP: 118/76  Weight: 119 lb (54 kg)  Height: 5\' 3"  (1.6 m)  Pulse 64 Body mass index is 21.08 kg/m.  General appearance:  Normal affect, orientation and appearance. Skin: Grossly normal HEENT: Without gross lesions.  No cervical or supraclavicular adenopathy. Thyroid normal.  Lungs:  Clear without wheezing, rales or rhonchi Cardiac: RR, without RMG Abdominal:  Soft, nontender, without masses, guarding, rebound, organomegaly or hernia Breasts:  Examined lying and sitting without masses, retractions, discharge or axillary adenopathy. Pelvic:  Ext, BUS, Vagina normal  Cervix normal  Uterus anteverted, normal size, shape and contour, midline and mobile nontender   Adnexa without masses or tenderness    Anus and perineum normal   Rectovaginal normal sphincter tone without palpated masses or tenderness.    Assessment/Plan:  50 y.o. G0P0 female for annual exam with regular menses although late this last month, tubal sterilization.   1. Last menstrual period for October. Patient is late at this time. Also reports having some night sweats at night intermittently. Not having hot flushes during the day. Not having vaginal dryness or irritation. Menses have been regular otherwise. Tubal sterilization. Check baseline FSH TSH. What to expect in the perimenopause was reviewed with her. I discussed that she may start having less frequent menses but as long as they are regular when they occur without prolonged or  atypical bleeding then we will monitor. If significant atypia and she knows to follow up for further evaluation. If more significant menopausal symptoms present and she wants to discuss then office visit to discuss treatment options. 2. Heart racing. Patient notes she has episodes where her heart is racing that sounds like she is having PSVT.  Does have lightheadness when it occurs but no chest pain, loss of consciousness, nausea, vomiting. Does not appear to have precipitating events and does not occur frequently. Pulse is regular at 64. Had been evaluated in the ER during an episode and they said that it was due to her heart racing and was transiently treated with medication but did not tolerate the side effects and has stopped this. I reviewed various strategies to include carotid massage, face in ice water or Valsalva. If this would continue to any significant degree or she developed other symptoms then I recommended cardiology evaluation. Patient will try the above in follow up if it continues to be an issue. 3. Mammography 01/2016. Continue with annual mammography when due. SBE monthly reviewed. 4. Pap smear/HPV 2015 negative. No Pap smear done today. No history of significant abnormal Pap smears. Plan repeat Pap smear at 5 year interval per current screening guidelines. 5. Health maintenance. No routine lab work done as patient reports this done elsewhere recently. She did ask about to do hemoglobin A1c as it has been elevated in the past and this was not checked at her primary physician's office. I added this to her above TSH FSH. Follow up in one year, sooner as needed.  Greater than 10 minutes of my time in  excess of her routine gynecologic exam was spent in direct face to face counseling and coordination of care in regards to her perimenopausal symptoms and treatment options as well as her probable PSVT.Marland Kitchen.    Dara LordsFONTAINE,Lakiyah Arntson P MD, 10:51 AM 06/02/2016

## 2016-06-03 LAB — HEMOGLOBIN A1C
Hgb A1c MFr Bld: 5.5 % (ref <5.7)
Mean Plasma Glucose: 111 mg/dL

## 2016-06-03 LAB — FOLLICLE STIMULATING HORMONE: FSH: 61.9 m[IU]/mL

## 2016-08-02 DIAGNOSIS — J00 Acute nasopharyngitis [common cold]: Secondary | ICD-10-CM | POA: Diagnosis not present

## 2016-08-02 DIAGNOSIS — J111 Influenza due to unidentified influenza virus with other respiratory manifestations: Secondary | ICD-10-CM | POA: Diagnosis not present

## 2016-11-23 DIAGNOSIS — Z0000 Encounter for general adult medical examination without abnormal findings: Secondary | ICD-10-CM | POA: Diagnosis not present

## 2016-11-23 DIAGNOSIS — Z13220 Encounter for screening for lipoid disorders: Secondary | ICD-10-CM | POA: Diagnosis not present

## 2016-11-23 DIAGNOSIS — Z1159 Encounter for screening for other viral diseases: Secondary | ICD-10-CM | POA: Diagnosis not present

## 2016-11-23 DIAGNOSIS — R739 Hyperglycemia, unspecified: Secondary | ICD-10-CM | POA: Diagnosis not present

## 2016-12-14 DIAGNOSIS — E860 Dehydration: Secondary | ICD-10-CM | POA: Diagnosis not present

## 2017-01-21 DIAGNOSIS — H04123 Dry eye syndrome of bilateral lacrimal glands: Secondary | ICD-10-CM | POA: Diagnosis not present

## 2017-01-21 DIAGNOSIS — H5213 Myopia, bilateral: Secondary | ICD-10-CM | POA: Diagnosis not present

## 2017-02-02 DIAGNOSIS — E860 Dehydration: Secondary | ICD-10-CM | POA: Diagnosis not present

## 2017-02-11 DIAGNOSIS — Z1231 Encounter for screening mammogram for malignant neoplasm of breast: Secondary | ICD-10-CM | POA: Diagnosis not present

## 2017-02-11 DIAGNOSIS — Z803 Family history of malignant neoplasm of breast: Secondary | ICD-10-CM | POA: Diagnosis not present

## 2017-05-13 DIAGNOSIS — N289 Disorder of kidney and ureter, unspecified: Secondary | ICD-10-CM | POA: Diagnosis not present

## 2017-06-13 ENCOUNTER — Encounter: Payer: BLUE CROSS/BLUE SHIELD | Admitting: Gynecology

## 2017-07-26 ENCOUNTER — Encounter: Payer: Self-pay | Admitting: Gynecology

## 2017-07-26 ENCOUNTER — Ambulatory Visit (INDEPENDENT_AMBULATORY_CARE_PROVIDER_SITE_OTHER): Payer: BLUE CROSS/BLUE SHIELD | Admitting: Gynecology

## 2017-07-26 VITALS — BP 118/76 | Ht 62.5 in | Wt 120.0 lb

## 2017-07-26 DIAGNOSIS — Z01419 Encounter for gynecological examination (general) (routine) without abnormal findings: Secondary | ICD-10-CM

## 2017-07-26 DIAGNOSIS — N951 Menopausal and female climacteric states: Secondary | ICD-10-CM

## 2017-07-26 NOTE — Patient Instructions (Signed)
Follow-up in 1 year for annual exam.  Follow-up sooner if menopausal symptoms or irregular bleeding becomes an issue.

## 2017-07-26 NOTE — Progress Notes (Signed)
    Tressa BusmanKaren Traweek 1965-08-03 [id number]        52 y.o.  G0P0 for annual gynecologic exam.  Overall doing well.  Patient is having night sweats on and off.  No hot flushes during the day.  No vaginal dryness.  Menses continue monthly without intermenstrual bleeding.  FSH last year 3161  Past medical history,surgical history, problem list, medications, allergies, family history and social history were all reviewed and documented as reviewed in the EPIC chart.  ROS:  Performed with pertinent positives and negatives included in the history, assessment and plan.   Additional significant findings : None   Exam: Kennon PortelaKim Gardner assistant Vitals:   07/26/17 0759  BP: 118/76  Weight: 120 lb (54.4 kg)  Height: 5' 2.5" (1.588 m)   Body mass index is 21.6 kg/m.  General appearance:  Normal affect, orientation and appearance. Skin: Grossly normal HEENT: Without gross lesions.  No cervical or supraclavicular adenopathy. Thyroid normal.  Lungs:  Clear without wheezing, rales or rhonchi Cardiac: RR, without RMG Abdominal:  Soft, nontender, without masses, guarding, rebound, organomegaly or hernia Breasts:  Examined lying and sitting without masses, retractions, discharge or axillary adenopathy. Pelvic:  Ext, BUS, Vagina: Normal  Cervix: Normal  Uterus: Anteverted, normal size, shape and contour, midline and mobile nontender   Adnexa: Without masses or tenderness    Anus and perineum: Normal   Rectovaginal: Normal sphincter tone without palpated masses or tenderness.    Assessment/Plan:  52 y.o. G0P0 female for annual gynecologic exam with regular menses, tubal sterilization.   1. Perimenopause.  Having some night sweats.  No other significant menopausal symptoms.  Reviewed the perimenopausal time frame with the patient.  I expect that she will start to skip menses this coming year given her elevated [facility] and her age.  Will keep menstrual calendar and as long as less frequent but fairly regular  when they occur then she will monitor.  If prolonged or atypical bleeding she will represent.  If significant menopausal symptoms develop and she wants to discuss HRT then she will represent for that discussion.  At this point recommended OTC product trial of soy based products  To see if it does not help with the night sweats. 2. Mammography 01/2017.  Continue with annual mammography when due.  SBE monthly reviewed. 3. Colonoscopy 2017.  Repeat at their recommended interval. 4. Pap smear/HPV 02/2014.  No Pap smear done today.  No history of significant abnormal Pap smears.  Plan repeat Pap smear at 5-year interval per current screening guidelines. 5. Health maintenance.  No routine lab work done as patient does this elsewhere.  Follow-up 1 year, sooner as needed.   Dara Lordsimothy P Ardian Haberland MD, 8:23 AM 07/26/2017

## 2017-08-30 DIAGNOSIS — N289 Disorder of kidney and ureter, unspecified: Secondary | ICD-10-CM | POA: Diagnosis not present

## 2017-12-14 DIAGNOSIS — R739 Hyperglycemia, unspecified: Secondary | ICD-10-CM | POA: Diagnosis not present

## 2017-12-14 DIAGNOSIS — Z0000 Encounter for general adult medical examination without abnormal findings: Secondary | ICD-10-CM | POA: Diagnosis not present

## 2017-12-14 DIAGNOSIS — Z13220 Encounter for screening for lipoid disorders: Secondary | ICD-10-CM | POA: Diagnosis not present

## 2018-01-17 DIAGNOSIS — L820 Inflamed seborrheic keratosis: Secondary | ICD-10-CM | POA: Diagnosis not present

## 2018-01-17 DIAGNOSIS — D2271 Melanocytic nevi of right lower limb, including hip: Secondary | ICD-10-CM | POA: Diagnosis not present

## 2018-01-17 DIAGNOSIS — D2239 Melanocytic nevi of other parts of face: Secondary | ICD-10-CM | POA: Diagnosis not present

## 2018-01-17 DIAGNOSIS — D2272 Melanocytic nevi of left lower limb, including hip: Secondary | ICD-10-CM | POA: Diagnosis not present

## 2018-02-02 DIAGNOSIS — H5213 Myopia, bilateral: Secondary | ICD-10-CM | POA: Diagnosis not present

## 2018-02-02 DIAGNOSIS — H04123 Dry eye syndrome of bilateral lacrimal glands: Secondary | ICD-10-CM | POA: Diagnosis not present

## 2018-02-17 ENCOUNTER — Encounter: Payer: Self-pay | Admitting: Gynecology

## 2018-02-17 DIAGNOSIS — Z803 Family history of malignant neoplasm of breast: Secondary | ICD-10-CM | POA: Diagnosis not present

## 2018-02-17 DIAGNOSIS — Z1231 Encounter for screening mammogram for malignant neoplasm of breast: Secondary | ICD-10-CM | POA: Diagnosis not present

## 2018-02-22 DIAGNOSIS — H902 Conductive hearing loss, unspecified: Secondary | ICD-10-CM | POA: Diagnosis not present

## 2018-03-28 DIAGNOSIS — H900 Conductive hearing loss, bilateral: Secondary | ICD-10-CM | POA: Diagnosis not present

## 2018-03-28 DIAGNOSIS — J301 Allergic rhinitis due to pollen: Secondary | ICD-10-CM | POA: Diagnosis not present

## 2018-04-04 DIAGNOSIS — L820 Inflamed seborrheic keratosis: Secondary | ICD-10-CM | POA: Diagnosis not present

## 2018-04-04 DIAGNOSIS — L570 Actinic keratosis: Secondary | ICD-10-CM | POA: Diagnosis not present

## 2018-07-27 ENCOUNTER — Ambulatory Visit (INDEPENDENT_AMBULATORY_CARE_PROVIDER_SITE_OTHER): Payer: BLUE CROSS/BLUE SHIELD | Admitting: Gynecology

## 2018-07-27 ENCOUNTER — Encounter: Payer: Self-pay | Admitting: Gynecology

## 2018-07-27 VITALS — BP 120/78 | Ht 62.5 in | Wt 123.0 lb

## 2018-07-27 DIAGNOSIS — N951 Menopausal and female climacteric states: Secondary | ICD-10-CM

## 2018-07-27 DIAGNOSIS — Z01419 Encounter for gynecological examination (general) (routine) without abnormal findings: Secondary | ICD-10-CM

## 2018-07-27 DIAGNOSIS — Z1151 Encounter for screening for human papillomavirus (HPV): Secondary | ICD-10-CM

## 2018-07-27 DIAGNOSIS — R87618 Other abnormal cytological findings on specimens from cervix uteri: Secondary | ICD-10-CM | POA: Diagnosis not present

## 2018-07-27 NOTE — Addendum Note (Signed)
Addended by: Dayna Barker on: 07/27/2018 09:12 AM   Modules accepted: Orders

## 2018-07-27 NOTE — Progress Notes (Signed)
    Fashionette Degrace 03-30-1966 [id number]        53 y.o.  G0P0 for annual gynecologic exam.  Having some irregularity in her menses.  No significant hot flushes or sweats.  Past medical history,surgical history, problem list, medications, allergies, family history and social history were all reviewed and documented as reviewed in the EPIC chart.  ROS:  Performed with pertinent positives and negatives included in the history, assessment and plan.   Additional significant findings : None   Exam: Kennon Portela assistant Vitals:   07/27/18 0825  BP: 120/78  Weight: 123 lb (55.8 kg)  Height: 5' 2.5" (1.588 m)   Body mass index is 22.14 kg/m.  General appearance:  Normal affect, orientation and appearance. Skin: Grossly normal HEENT: Without gross lesions.  No cervical or supraclavicular adenopathy. Thyroid normal.  Lungs:  Clear without wheezing, rales or rhonchi Cardiac: RR, without RMG Abdominal:  Soft, nontender, without masses, guarding, rebound, organomegaly or hernia Breasts:  Examined lying and sitting without masses, retractions, discharge or axillary adenopathy. Pelvic:  Ext, BUS, Vagina: Normal with light menstrual staining  Cervix: Normal.  Pap smear/HPV  Uterus: Anteverted, normal size, shape and contour, midline and mobile nontender   Adnexa: Without masses or tenderness    Anus and perineum: Normal   Rectovaginal: Normal sphincter tone without palpated masses or tenderness.    Assessment/Plan:  53 y.o. G0P0 female for annual gynecologic exam with mild irregular menses, tubal sterilization.   1. Perimenopause.  FSH 61 two years ago.  Having some irregularity in her cycles which she will skip several months at a time.  Overall though having regular menses when occurs.  Had episodes of hot flushes and sweats but not now.  We discussed what to expect in the perimenopause.  She will keep a menstrual calendar and as long as less frequent but no significant prolonged or  atypical bleeding or significant menopausal symptoms she will monitor. 2. Mammography 02/2018.  Continue with annual mammography when due.  Breast exam normal today. 3. Pap smear/HPV 2015.  Pap smear/HPV today.  No history of abnormal Pap smears previously. 4. Colonoscopy 2017.  Repeat at their recommended interval. 5. Health maintenance.  Reports routine lab work done elsewhere.  She did have borderline glucose and hemoglobin A1c as well as mild elevations in creatinine previously and asked if I could repeat these.  Comprehensive metabolic panel and hemoglobin Q9U ordered.  Follow-up in 1 year, sooner as needed.   Dara Lords MD, 8:50 AM 07/27/2018

## 2018-07-27 NOTE — Patient Instructions (Signed)
Follow-up if significant irregular bleeding or significant menopausal symptoms  Follow-up in 1 year for annual exam

## 2018-07-28 ENCOUNTER — Encounter: Payer: Self-pay | Admitting: Gynecology

## 2018-07-28 LAB — COMPREHENSIVE METABOLIC PANEL
AG RATIO: 1.8 (calc) (ref 1.0–2.5)
ALBUMIN MSPROF: 4.4 g/dL (ref 3.6–5.1)
ALT: 14 U/L (ref 6–29)
AST: 17 U/L (ref 10–35)
Alkaline phosphatase (APISO): 33 U/L (ref 33–130)
BUN / CREAT RATIO: 14 (calc) (ref 6–22)
BUN: 15 mg/dL (ref 7–25)
CALCIUM: 9.1 mg/dL (ref 8.6–10.4)
CO2: 26 mmol/L (ref 20–32)
Chloride: 99 mmol/L (ref 98–110)
Creat: 1.11 mg/dL — ABNORMAL HIGH (ref 0.50–1.05)
Globulin: 2.5 g/dL (calc) (ref 1.9–3.7)
Glucose, Bld: 98 mg/dL (ref 65–99)
Potassium: 4.3 mmol/L (ref 3.5–5.3)
Sodium: 135 mmol/L (ref 135–146)
TOTAL PROTEIN: 6.9 g/dL (ref 6.1–8.1)
Total Bilirubin: 0.6 mg/dL (ref 0.2–1.2)

## 2018-07-28 LAB — HEMOGLOBIN A1C
HEMOGLOBIN A1C: 5.7 %{Hb} — AB (ref <5.7)
Mean Plasma Glucose: 117 (calc)
eAG (mmol/L): 6.5 (calc)

## 2018-07-31 LAB — PAP IG AND HPV HIGH-RISK: HPV DNA High Risk: NOT DETECTED

## 2018-12-26 DIAGNOSIS — H9190 Unspecified hearing loss, unspecified ear: Secondary | ICD-10-CM | POA: Diagnosis not present

## 2018-12-26 DIAGNOSIS — Z0000 Encounter for general adult medical examination without abnormal findings: Secondary | ICD-10-CM | POA: Diagnosis not present

## 2018-12-26 DIAGNOSIS — J309 Allergic rhinitis, unspecified: Secondary | ICD-10-CM | POA: Diagnosis not present

## 2018-12-26 DIAGNOSIS — N183 Chronic kidney disease, stage 3 (moderate): Secondary | ICD-10-CM | POA: Diagnosis not present

## 2018-12-26 DIAGNOSIS — R7303 Prediabetes: Secondary | ICD-10-CM | POA: Diagnosis not present

## 2019-01-09 DIAGNOSIS — R7303 Prediabetes: Secondary | ICD-10-CM | POA: Diagnosis not present

## 2019-01-09 DIAGNOSIS — Z13220 Encounter for screening for lipoid disorders: Secondary | ICD-10-CM | POA: Diagnosis not present

## 2019-02-05 DIAGNOSIS — N183 Chronic kidney disease, stage 3 (moderate): Secondary | ICD-10-CM | POA: Diagnosis not present

## 2019-02-08 DIAGNOSIS — H5213 Myopia, bilateral: Secondary | ICD-10-CM | POA: Diagnosis not present

## 2019-02-08 DIAGNOSIS — H04123 Dry eye syndrome of bilateral lacrimal glands: Secondary | ICD-10-CM | POA: Diagnosis not present

## 2019-02-08 DIAGNOSIS — H2513 Age-related nuclear cataract, bilateral: Secondary | ICD-10-CM | POA: Diagnosis not present

## 2019-03-05 ENCOUNTER — Encounter: Payer: Self-pay | Admitting: Gynecology

## 2019-03-05 DIAGNOSIS — Z1231 Encounter for screening mammogram for malignant neoplasm of breast: Secondary | ICD-10-CM | POA: Diagnosis not present

## 2019-03-05 DIAGNOSIS — Z803 Family history of malignant neoplasm of breast: Secondary | ICD-10-CM | POA: Diagnosis not present

## 2019-04-11 ENCOUNTER — Encounter: Payer: Self-pay | Admitting: Gynecology

## 2019-08-06 DIAGNOSIS — Z20822 Contact with and (suspected) exposure to covid-19: Secondary | ICD-10-CM | POA: Diagnosis not present

## 2019-08-06 DIAGNOSIS — Z1159 Encounter for screening for other viral diseases: Secondary | ICD-10-CM | POA: Diagnosis not present

## 2019-08-27 ENCOUNTER — Other Ambulatory Visit: Payer: Self-pay

## 2019-08-28 ENCOUNTER — Encounter: Payer: Self-pay | Admitting: Obstetrics and Gynecology

## 2019-08-28 ENCOUNTER — Ambulatory Visit (INDEPENDENT_AMBULATORY_CARE_PROVIDER_SITE_OTHER): Payer: BC Managed Care – PPO | Admitting: Obstetrics and Gynecology

## 2019-08-28 VITALS — BP 118/76 | Ht 62.0 in | Wt 124.0 lb

## 2019-08-28 DIAGNOSIS — Z01419 Encounter for gynecological examination (general) (routine) without abnormal findings: Secondary | ICD-10-CM | POA: Diagnosis not present

## 2019-08-28 DIAGNOSIS — Z13220 Encounter for screening for lipoid disorders: Secondary | ICD-10-CM | POA: Diagnosis not present

## 2019-08-28 DIAGNOSIS — N951 Menopausal and female climacteric states: Secondary | ICD-10-CM

## 2019-08-28 NOTE — Progress Notes (Signed)
   Kelsey Nolan 1966-02-27 [id number]  SUBJECTIVE:  54 y.o. G0P0 female for annual routine gynecologic exam and Pap smear. She has no gynecologic concerns.  Minor hot flashes at night but manageable.   Current Outpatient Medications  Medication Sig Dispense Refill  . fluticasone (FLONASE) 50 MCG/ACT nasal spray Place into both nostrils daily.    . IRON PO Take by mouth.      . Loratadine (ALAVERT PO) Take by mouth. prn     . Multiple Vitamin (MULTIVITAMIN PO) Take by mouth.      . Probiotic Product (PROBIOTIC FORMULA PO) Take by mouth.       No current facility-administered medications for this visit.   Allergies: Codeine  LMP November 2020 (just spotting)  Past medical history,surgical history, problem list, medications, allergies, family history and social history were all reviewed and documented as reviewed in the EPIC chart.  ROS:  Feeling well. No dyspnea or chest pain on exertion.  No abdominal pain, change in bowel habits, black or bloody stools.  No urinary tract symptoms. GYN ROS: infrequent menses, no abnormal bleeding, pelvic pain or discharge, no breast pain or new or enlarging lumps on self exam. No neurological complaints.   OBJECTIVE:  BP 118/76   Ht 5\' 2"  (1.575 m)   Wt 124 lb (56.2 kg)   BMI 22.68 kg/m  The patient appears well, alert, oriented x 3, in no distress. ENT normal.  Neck supple. No cervical or supraclavicular adenopathy or thyromegaly.  Lungs are clear, good air entry, no wheezes, rhonchi or rales. S1 and S2 normal, no murmurs, regular rate and rhythm.  Abdomen soft without tenderness, guarding, mass or organomegaly.  Neurological is normal, no focal findings.  BREAST EXAM: breasts appear normal, no suspicious masses, no skin or nipple changes or axillary nodes  PELVIC EXAM: VULVA: normal appearing vulva with no masses, tenderness or lesions, VAGINA: normal appearing vagina with normal color and discharge, no lesions, CERVIX: normal appearing  cervix without discharge or lesions, UTERUS: uterus is normal size, shape, consistency and nontender, ADNEXA: normal adnexa in size, nontender and no masses, RECTAL: normal rectal, no masses, perianal skin tag or hemorrhoid < 1 cm in size  Chaperone: present during the examination  ASSESSMENT:  54 y.o. G0P0 here for annual gynecologic exam  PLAN:   1. Perimenopausal.  Infrequent light menses, but not amenorrheic for a year yet. Previous tubal ligation.  Mild vasomotor symptoms but manageable at this time. 2. Pap smear/HPV 07/2018 normal. Not repeated today. No prior history of abnormal Pap smears. Next Pap smear due 2025 following the current screening guidelines calling for the 5-year interval. 3. Mammogram 02/2019. Will continue with annual mammography. Breast exam normal today. 4. Colonoscopy 2017. Recommended that she continue per the prescribed interval.   5. Encouraged adequate calcium and vitamin D between dietary sources and supplement if needed. 6. Health maintenance.  Elevations in creatinine and borderline hemoglobin A1C noted last year (mother with DM). She will proceed to lab today for CBC, CMP, lipid profile, vitamin D.  She also has a primary care doctor that she sees.  Return annually or sooner, prn.  2018 MD  08/28/19

## 2019-08-28 NOTE — Patient Instructions (Signed)
Please remember to get adequate vitamin D ([phone] IU per day) and calcium (1200 mg per day) from dietary sources and supplement if needed We will let you know about your lab results when available.

## 2019-08-29 LAB — COMPREHENSIVE METABOLIC PANEL
AG Ratio: 1.9 (calc) (ref 1.0–2.5)
ALT: 24 U/L (ref 6–29)
AST: 23 U/L (ref 10–35)
Albumin: 4.6 g/dL (ref 3.6–5.1)
Alkaline phosphatase (APISO): 36 U/L — ABNORMAL LOW (ref 37–153)
BUN: 10 mg/dL (ref 7–25)
CO2: 27 mmol/L (ref 20–32)
Calcium: 9.1 mg/dL (ref 8.6–10.4)
Chloride: 101 mmol/L (ref 98–110)
Creat: 0.93 mg/dL (ref 0.50–1.05)
Globulin: 2.4 g/dL (calc) (ref 1.9–3.7)
Glucose, Bld: 104 mg/dL — ABNORMAL HIGH (ref 65–99)
Potassium: 4.1 mmol/L (ref 3.5–5.3)
Sodium: 137 mmol/L (ref 135–146)
Total Bilirubin: 0.6 mg/dL (ref 0.2–1.2)
Total Protein: 7 g/dL (ref 6.1–8.1)

## 2019-08-29 LAB — LIPID PANEL
Cholesterol: 213 mg/dL — ABNORMAL HIGH (ref <200)
HDL: 71 mg/dL (ref >=50)
LDL Cholesterol (Calc): 126 mg/dL (calc) — ABNORMAL HIGH
Non-HDL Cholesterol (Calc): 142 mg/dL (calc) — ABNORMAL HIGH (ref <130)
Total CHOL/HDL Ratio: 3 (calc) (ref <5.0)
Triglycerides: 68 mg/dL (ref <150)

## 2019-08-29 LAB — VITAMIN D 25 HYDROXY (VIT D DEFICIENCY, FRACTURES): Vit D, 25-Hydroxy: 29 ng/mL — ABNORMAL LOW (ref 30–100)

## 2019-08-29 LAB — CBC
HCT: 37.5 % (ref 35.0–45.0)
Hemoglobin: 12.7 g/dL (ref 11.7–15.5)
MCH: 30.2 pg (ref 27.0–33.0)
MCHC: 33.9 g/dL (ref 32.0–36.0)
MCV: 89.1 fL (ref 80.0–100.0)
MPV: 9.8 fL (ref 7.5–12.5)
Platelets: 237 10*3/uL (ref 140–400)
RBC: 4.21 10*6/uL (ref 3.80–5.10)
RDW: 12.5 % (ref 11.0–15.0)
WBC: 4.9 10*3/uL (ref 3.8–10.8)

## 2019-08-29 LAB — HEMOGLOBIN A1C
Hgb A1c MFr Bld: 5.9 % of total Hgb — ABNORMAL HIGH (ref <5.7)
Mean Plasma Glucose: 123 (calc)
eAG (mmol/L): 6.8 (calc)

## 2019-09-24 DIAGNOSIS — K219 Gastro-esophageal reflux disease without esophagitis: Secondary | ICD-10-CM | POA: Diagnosis not present

## 2019-09-24 DIAGNOSIS — M67432 Ganglion, left wrist: Secondary | ICD-10-CM | POA: Diagnosis not present

## 2019-12-31 DIAGNOSIS — Z0000 Encounter for general adult medical examination without abnormal findings: Secondary | ICD-10-CM | POA: Diagnosis not present

## 2019-12-31 DIAGNOSIS — R7309 Other abnormal glucose: Secondary | ICD-10-CM | POA: Diagnosis not present

## 2019-12-31 DIAGNOSIS — Z13220 Encounter for screening for lipoid disorders: Secondary | ICD-10-CM | POA: Diagnosis not present

## 2019-12-31 DIAGNOSIS — J309 Allergic rhinitis, unspecified: Secondary | ICD-10-CM | POA: Diagnosis not present

## 2019-12-31 DIAGNOSIS — M7711 Lateral epicondylitis, right elbow: Secondary | ICD-10-CM | POA: Diagnosis not present

## 2019-12-31 DIAGNOSIS — N1831 Chronic kidney disease, stage 3a: Secondary | ICD-10-CM | POA: Diagnosis not present

## 2020-02-11 DIAGNOSIS — H5213 Myopia, bilateral: Secondary | ICD-10-CM | POA: Diagnosis not present

## 2020-02-11 DIAGNOSIS — H2513 Age-related nuclear cataract, bilateral: Secondary | ICD-10-CM | POA: Diagnosis not present

## 2020-03-19 ENCOUNTER — Encounter: Payer: Self-pay | Admitting: Obstetrics and Gynecology

## 2020-03-19 DIAGNOSIS — Z1231 Encounter for screening mammogram for malignant neoplasm of breast: Secondary | ICD-10-CM | POA: Diagnosis not present

## 2020-07-04 DIAGNOSIS — R7303 Prediabetes: Secondary | ICD-10-CM | POA: Diagnosis not present

## 2020-08-28 ENCOUNTER — Other Ambulatory Visit: Payer: Self-pay

## 2020-08-28 ENCOUNTER — Ambulatory Visit (INDEPENDENT_AMBULATORY_CARE_PROVIDER_SITE_OTHER): Payer: 59 | Admitting: Obstetrics and Gynecology

## 2020-08-28 ENCOUNTER — Encounter: Payer: Self-pay | Admitting: Obstetrics and Gynecology

## 2020-08-28 VITALS — BP 168/82 | HR 68 | Ht 62.25 in | Wt 124.0 lb

## 2020-08-28 DIAGNOSIS — Z833 Family history of diabetes mellitus: Secondary | ICD-10-CM | POA: Diagnosis not present

## 2020-08-28 DIAGNOSIS — Z1329 Encounter for screening for other suspected endocrine disorder: Secondary | ICD-10-CM

## 2020-08-28 DIAGNOSIS — Z01419 Encounter for gynecological examination (general) (routine) without abnormal findings: Secondary | ICD-10-CM | POA: Diagnosis not present

## 2020-08-28 DIAGNOSIS — Z1321 Encounter for screening for nutritional disorder: Secondary | ICD-10-CM

## 2020-08-28 DIAGNOSIS — Z13220 Encounter for screening for lipoid disorders: Secondary | ICD-10-CM

## 2020-08-28 DIAGNOSIS — R030 Elevated blood-pressure reading, without diagnosis of hypertension: Secondary | ICD-10-CM

## 2020-08-28 LAB — CBC: MPV: 9.6 fL (ref 7.5–12.5)

## 2020-08-28 LAB — COMPREHENSIVE METABOLIC PANEL
AST: 18 U/L (ref 10–35)
Total Bilirubin: 0.6 mg/dL (ref 0.2–1.2)

## 2020-08-28 LAB — LIPID PANEL: Non-HDL Cholesterol (Calc): 146 mg/dL (calc) — ABNORMAL HIGH (ref <130)

## 2020-08-28 NOTE — Progress Notes (Signed)
Kelsey Nolan 04/20/1966 [id number]  SUBJECTIVE:  55 y.o. G0P0 female for annual routine gynecologic exam and Pap smear. She has no gynecologic concerns.  Minor hot flashes at night but still manageable.   Current Outpatient Medications  Medication Sig Dispense Refill  . Cholecalciferol (VITAMIN D) 50 MCG (2000 UT) tablet 1 tablet    . famotidine (PEPCID) 40 MG tablet 1 tablet    . fluticasone (FLONASE) 50 MCG/ACT nasal spray Place into both nostrils daily.    . IRON PO Take by mouth.    . Loratadine (ALAVERT PO) Take by mouth. prn    . Multiple Vitamin (MULTIVITAMIN PO) Take by mouth.    . Probiotic Product (PROBIOTIC FORMULA PO) Take by mouth.     No current facility-administered medications for this visit.   Allergies: Codeine  LMP November 2020 (just spotting)  Past medical history,surgical history, problem list, medications, allergies, family history and social history were all reviewed and documented as reviewed in the EPIC chart.  ROS: Pertinent positives and negatives as reviewed in HPI  OBJECTIVE:  BP (!) 168/82 (BP Location: Left Arm, Patient Position: Sitting, Cuff Size: Normal)   Pulse 68   Ht 5' 2.25" (1.581 m)   Wt 124 lb (56.2 kg)   BMI 22.50 kg/m   BP recheck 142/84  The patient appears well, alert, oriented, in no distress. ENT normal. No cervical or supraclavicular adenopathy or thyromegaly.  Lungs are clear, good air entry, no wheezes, rhonchi or rales. S1 and S2 normal, no murmurs, regular rate and rhythm.  Abdomen soft without tenderness, guarding, mass or organomegaly.  Neurological is normal, no focal findings.  BREAST EXAM: breasts appear normal, no suspicious masses, no skin or nipple changes or axillary nodes  PELVIC EXAM: VULVA: normal appearing vulva with no masses, tenderness or lesions, VAGINA: normal appearing vagina with normal color and discharge, no lesions, CERVIX: normal appearing cervix without discharge or lesions, UTERUS: uterus is  normal size, shape, consistency and nontender, ADNEXA: normal adnexa in size, nontender and no masses  Chaperone: Zenovia Jordan present during the examination  ASSESSMENT:  55 y.o. G0P0 here for annual gynecologic exam  PLAN:   1. Postmenopausal.  No vaginal bleeding. Mild vasomotor symptoms but manageable at this time. Recommended trying OTC products (black cohosh, soy supplement).  Will follow-up if symptoms intensify and become unbearable. 2. Pap smear/HPV 07/2018 normal. Not repeated today. No prior history of abnormal Pap smears. Next Pap smear due 2025 following the current screening guidelines calling for the 5-year interval. 3. Mammogram 03/2020. Will continue with annual mammography. Breast exam normal today. 4. Colonoscopy 2017. Recommended that she continue per the prescribed interval.   5. Encouraged adequate calcium and vitamin D between dietary sources and supplement if needed. 6. Health maintenance.  She will proceed to lab today for CBC, CMP, lipid profile, vitamin D.  She also has a primary care doctor that she sees.  High blood pressure noted.  Of note, the patient is going through some life stressors with her mother being treated for a cancer.  I encouraged her to keep track of her blood pressure at home and if persistently elevated she will need to notify her primary physician for blood pressure management, discussed some of the dangers of ongoing hypertension.  The patient is aware that I will only be at this practice until early March 2022 so she knows to make sure she requests follow-up on results for any medical tests that she does when I am  no longer at the practice.   Return annually or sooner, prn.  Theresia Majors MD  08/28/20

## 2020-08-29 LAB — COMPREHENSIVE METABOLIC PANEL
ALT: 14 U/L (ref 6–29)
Albumin: 4.6 g/dL (ref 3.6–5.1)
Alkaline phosphatase (APISO): 40 U/L (ref 37–153)
Calcium: 9.6 mg/dL (ref 8.6–10.4)
Chloride: 98 mmol/L (ref 98–110)
Creat: 0.95 mg/dL (ref 0.50–1.05)
Globulin: 2.4 g/dL (calc) (ref 1.9–3.7)
Glucose, Bld: 105 mg/dL — ABNORMAL HIGH (ref 65–99)
Potassium: 4.1 mmol/L (ref 3.5–5.3)
Total Protein: 7 g/dL (ref 6.1–8.1)

## 2020-08-29 LAB — COMPREHENSIVE METABOLIC PANEL WITH GFR
AG Ratio: 1.9 (calc) (ref 1.0–2.5)
BUN: 13 mg/dL (ref 7–25)
CO2: 29 mmol/L (ref 20–32)
Sodium: 136 mmol/L (ref 135–146)

## 2020-08-29 LAB — CBC
HCT: 37.8 % (ref 35.0–45.0)
Hemoglobin: 13 g/dL (ref 11.7–15.5)
MCH: 31 pg (ref 27.0–33.0)
MCHC: 34.4 g/dL (ref 32.0–36.0)
MCV: 90.2 fL (ref 80.0–100.0)
Platelets: 308 10*3/uL (ref 140–400)
RBC: 4.19 Million/uL (ref 3.80–5.10)
RDW: 12.3 % (ref 11.0–15.0)
WBC: 6.7 10*3/uL (ref 3.8–10.8)

## 2020-08-29 LAB — LIPID PANEL
Cholesterol: 206 mg/dL — ABNORMAL HIGH (ref <200)
HDL: 60 mg/dL (ref >=50)
LDL Cholesterol (Calc): 130 mg/dL (calc) — ABNORMAL HIGH
Total CHOL/HDL Ratio: 3.4 (calc) (ref <5.0)
Triglycerides: 66 mg/dL (ref <150)

## 2020-08-29 LAB — VITAMIN D 25 HYDROXY (VIT D DEFICIENCY, FRACTURES): Vit D, 25-Hydroxy: 45 ng/mL (ref 30–100)

## 2020-08-29 LAB — HEMOGLOBIN A1C
Hgb A1c MFr Bld: 6 % of total Hgb — ABNORMAL HIGH (ref <5.7)
Mean Plasma Glucose: 126 mg/dL
eAG (mmol/L): 7 mmol/L

## 2020-08-29 LAB — TSH: TSH: 5.31 m[IU]/L — ABNORMAL HIGH

## 2021-08-03 DIAGNOSIS — Q72892 Other reduction defects of left lower limb: Secondary | ICD-10-CM | POA: Diagnosis not present

## 2021-08-03 DIAGNOSIS — M9903 Segmental and somatic dysfunction of lumbar region: Secondary | ICD-10-CM | POA: Diagnosis not present

## 2021-08-03 DIAGNOSIS — M9904 Segmental and somatic dysfunction of sacral region: Secondary | ICD-10-CM | POA: Diagnosis not present

## 2021-08-03 DIAGNOSIS — M5136 Other intervertebral disc degeneration, lumbar region: Secondary | ICD-10-CM | POA: Diagnosis not present

## 2021-08-03 DIAGNOSIS — M5431 Sciatica, right side: Secondary | ICD-10-CM | POA: Diagnosis not present

## 2021-08-03 DIAGNOSIS — M9905 Segmental and somatic dysfunction of pelvic region: Secondary | ICD-10-CM | POA: Diagnosis not present

## 2021-08-03 DIAGNOSIS — M5135 Other intervertebral disc degeneration, thoracolumbar region: Secondary | ICD-10-CM | POA: Diagnosis not present

## 2021-08-04 DIAGNOSIS — M5431 Sciatica, right side: Secondary | ICD-10-CM | POA: Diagnosis not present

## 2021-08-04 DIAGNOSIS — M9903 Segmental and somatic dysfunction of lumbar region: Secondary | ICD-10-CM | POA: Diagnosis not present

## 2021-08-04 DIAGNOSIS — Q72892 Other reduction defects of left lower limb: Secondary | ICD-10-CM | POA: Diagnosis not present

## 2021-08-04 DIAGNOSIS — M9904 Segmental and somatic dysfunction of sacral region: Secondary | ICD-10-CM | POA: Diagnosis not present

## 2021-08-04 DIAGNOSIS — M9905 Segmental and somatic dysfunction of pelvic region: Secondary | ICD-10-CM | POA: Diagnosis not present

## 2021-08-04 DIAGNOSIS — M5135 Other intervertebral disc degeneration, thoracolumbar region: Secondary | ICD-10-CM | POA: Diagnosis not present

## 2021-08-04 DIAGNOSIS — M5136 Other intervertebral disc degeneration, lumbar region: Secondary | ICD-10-CM | POA: Diagnosis not present

## 2021-08-06 DIAGNOSIS — M5431 Sciatica, right side: Secondary | ICD-10-CM | POA: Diagnosis not present

## 2021-08-06 DIAGNOSIS — M9905 Segmental and somatic dysfunction of pelvic region: Secondary | ICD-10-CM | POA: Diagnosis not present

## 2021-08-06 DIAGNOSIS — Q72892 Other reduction defects of left lower limb: Secondary | ICD-10-CM | POA: Diagnosis not present

## 2021-08-06 DIAGNOSIS — M9904 Segmental and somatic dysfunction of sacral region: Secondary | ICD-10-CM | POA: Diagnosis not present

## 2021-08-06 DIAGNOSIS — M5136 Other intervertebral disc degeneration, lumbar region: Secondary | ICD-10-CM | POA: Diagnosis not present

## 2021-08-06 DIAGNOSIS — M9903 Segmental and somatic dysfunction of lumbar region: Secondary | ICD-10-CM | POA: Diagnosis not present

## 2021-08-06 DIAGNOSIS — M5135 Other intervertebral disc degeneration, thoracolumbar region: Secondary | ICD-10-CM | POA: Diagnosis not present

## 2021-08-10 DIAGNOSIS — M9905 Segmental and somatic dysfunction of pelvic region: Secondary | ICD-10-CM | POA: Diagnosis not present

## 2021-08-10 DIAGNOSIS — M9904 Segmental and somatic dysfunction of sacral region: Secondary | ICD-10-CM | POA: Diagnosis not present

## 2021-08-10 DIAGNOSIS — M5135 Other intervertebral disc degeneration, thoracolumbar region: Secondary | ICD-10-CM | POA: Diagnosis not present

## 2021-08-10 DIAGNOSIS — M5136 Other intervertebral disc degeneration, lumbar region: Secondary | ICD-10-CM | POA: Diagnosis not present

## 2021-08-10 DIAGNOSIS — M5431 Sciatica, right side: Secondary | ICD-10-CM | POA: Diagnosis not present

## 2021-08-10 DIAGNOSIS — M9903 Segmental and somatic dysfunction of lumbar region: Secondary | ICD-10-CM | POA: Diagnosis not present

## 2021-08-10 DIAGNOSIS — Q72892 Other reduction defects of left lower limb: Secondary | ICD-10-CM | POA: Diagnosis not present

## 2021-08-11 DIAGNOSIS — M9903 Segmental and somatic dysfunction of lumbar region: Secondary | ICD-10-CM | POA: Diagnosis not present

## 2021-08-11 DIAGNOSIS — M5431 Sciatica, right side: Secondary | ICD-10-CM | POA: Diagnosis not present

## 2021-08-11 DIAGNOSIS — M9905 Segmental and somatic dysfunction of pelvic region: Secondary | ICD-10-CM | POA: Diagnosis not present

## 2021-08-11 DIAGNOSIS — M5135 Other intervertebral disc degeneration, thoracolumbar region: Secondary | ICD-10-CM | POA: Diagnosis not present

## 2021-08-11 DIAGNOSIS — M9904 Segmental and somatic dysfunction of sacral region: Secondary | ICD-10-CM | POA: Diagnosis not present

## 2021-08-11 DIAGNOSIS — Q72892 Other reduction defects of left lower limb: Secondary | ICD-10-CM | POA: Diagnosis not present

## 2021-08-11 DIAGNOSIS — M5136 Other intervertebral disc degeneration, lumbar region: Secondary | ICD-10-CM | POA: Diagnosis not present

## 2021-08-13 DIAGNOSIS — M9905 Segmental and somatic dysfunction of pelvic region: Secondary | ICD-10-CM | POA: Diagnosis not present

## 2021-08-13 DIAGNOSIS — Q72892 Other reduction defects of left lower limb: Secondary | ICD-10-CM | POA: Diagnosis not present

## 2021-08-13 DIAGNOSIS — M9904 Segmental and somatic dysfunction of sacral region: Secondary | ICD-10-CM | POA: Diagnosis not present

## 2021-08-13 DIAGNOSIS — M9903 Segmental and somatic dysfunction of lumbar region: Secondary | ICD-10-CM | POA: Diagnosis not present

## 2021-08-13 DIAGNOSIS — M5135 Other intervertebral disc degeneration, thoracolumbar region: Secondary | ICD-10-CM | POA: Diagnosis not present

## 2021-08-13 DIAGNOSIS — M5136 Other intervertebral disc degeneration, lumbar region: Secondary | ICD-10-CM | POA: Diagnosis not present

## 2021-08-13 DIAGNOSIS — M5431 Sciatica, right side: Secondary | ICD-10-CM | POA: Diagnosis not present

## 2021-08-17 DIAGNOSIS — M5135 Other intervertebral disc degeneration, thoracolumbar region: Secondary | ICD-10-CM | POA: Diagnosis not present

## 2021-08-17 DIAGNOSIS — M9903 Segmental and somatic dysfunction of lumbar region: Secondary | ICD-10-CM | POA: Diagnosis not present

## 2021-08-17 DIAGNOSIS — M9904 Segmental and somatic dysfunction of sacral region: Secondary | ICD-10-CM | POA: Diagnosis not present

## 2021-08-17 DIAGNOSIS — M5136 Other intervertebral disc degeneration, lumbar region: Secondary | ICD-10-CM | POA: Diagnosis not present

## 2021-08-17 DIAGNOSIS — M5431 Sciatica, right side: Secondary | ICD-10-CM | POA: Diagnosis not present

## 2021-08-17 DIAGNOSIS — Q72892 Other reduction defects of left lower limb: Secondary | ICD-10-CM | POA: Diagnosis not present

## 2021-08-17 DIAGNOSIS — M9905 Segmental and somatic dysfunction of pelvic region: Secondary | ICD-10-CM | POA: Diagnosis not present

## 2021-08-18 DIAGNOSIS — M5136 Other intervertebral disc degeneration, lumbar region: Secondary | ICD-10-CM | POA: Diagnosis not present

## 2021-08-18 DIAGNOSIS — M9904 Segmental and somatic dysfunction of sacral region: Secondary | ICD-10-CM | POA: Diagnosis not present

## 2021-08-18 DIAGNOSIS — M9905 Segmental and somatic dysfunction of pelvic region: Secondary | ICD-10-CM | POA: Diagnosis not present

## 2021-08-18 DIAGNOSIS — M5431 Sciatica, right side: Secondary | ICD-10-CM | POA: Diagnosis not present

## 2021-08-18 DIAGNOSIS — M5135 Other intervertebral disc degeneration, thoracolumbar region: Secondary | ICD-10-CM | POA: Diagnosis not present

## 2021-08-18 DIAGNOSIS — Q72892 Other reduction defects of left lower limb: Secondary | ICD-10-CM | POA: Diagnosis not present

## 2021-08-18 DIAGNOSIS — M9903 Segmental and somatic dysfunction of lumbar region: Secondary | ICD-10-CM | POA: Diagnosis not present

## 2021-08-20 DIAGNOSIS — M5431 Sciatica, right side: Secondary | ICD-10-CM | POA: Diagnosis not present

## 2021-08-20 DIAGNOSIS — M5136 Other intervertebral disc degeneration, lumbar region: Secondary | ICD-10-CM | POA: Diagnosis not present

## 2021-08-20 DIAGNOSIS — M5135 Other intervertebral disc degeneration, thoracolumbar region: Secondary | ICD-10-CM | POA: Diagnosis not present

## 2021-08-20 DIAGNOSIS — M9903 Segmental and somatic dysfunction of lumbar region: Secondary | ICD-10-CM | POA: Diagnosis not present

## 2021-08-20 DIAGNOSIS — M9905 Segmental and somatic dysfunction of pelvic region: Secondary | ICD-10-CM | POA: Diagnosis not present

## 2021-08-20 DIAGNOSIS — M9904 Segmental and somatic dysfunction of sacral region: Secondary | ICD-10-CM | POA: Diagnosis not present

## 2021-08-20 DIAGNOSIS — Q72892 Other reduction defects of left lower limb: Secondary | ICD-10-CM | POA: Diagnosis not present

## 2021-08-24 DIAGNOSIS — M9903 Segmental and somatic dysfunction of lumbar region: Secondary | ICD-10-CM | POA: Diagnosis not present

## 2021-08-24 DIAGNOSIS — M5431 Sciatica, right side: Secondary | ICD-10-CM | POA: Diagnosis not present

## 2021-08-24 DIAGNOSIS — Q72892 Other reduction defects of left lower limb: Secondary | ICD-10-CM | POA: Diagnosis not present

## 2021-08-24 DIAGNOSIS — M9904 Segmental and somatic dysfunction of sacral region: Secondary | ICD-10-CM | POA: Diagnosis not present

## 2021-08-24 DIAGNOSIS — M5136 Other intervertebral disc degeneration, lumbar region: Secondary | ICD-10-CM | POA: Diagnosis not present

## 2021-08-24 DIAGNOSIS — M5135 Other intervertebral disc degeneration, thoracolumbar region: Secondary | ICD-10-CM | POA: Diagnosis not present

## 2021-08-24 DIAGNOSIS — M9905 Segmental and somatic dysfunction of pelvic region: Secondary | ICD-10-CM | POA: Diagnosis not present

## 2021-08-25 DIAGNOSIS — M9903 Segmental and somatic dysfunction of lumbar region: Secondary | ICD-10-CM | POA: Diagnosis not present

## 2021-08-25 DIAGNOSIS — M5136 Other intervertebral disc degeneration, lumbar region: Secondary | ICD-10-CM | POA: Diagnosis not present

## 2021-08-25 DIAGNOSIS — Q72892 Other reduction defects of left lower limb: Secondary | ICD-10-CM | POA: Diagnosis not present

## 2021-08-25 DIAGNOSIS — M5431 Sciatica, right side: Secondary | ICD-10-CM | POA: Diagnosis not present

## 2021-08-25 DIAGNOSIS — M9905 Segmental and somatic dysfunction of pelvic region: Secondary | ICD-10-CM | POA: Diagnosis not present

## 2021-08-25 DIAGNOSIS — M5135 Other intervertebral disc degeneration, thoracolumbar region: Secondary | ICD-10-CM | POA: Diagnosis not present

## 2021-08-25 DIAGNOSIS — M9904 Segmental and somatic dysfunction of sacral region: Secondary | ICD-10-CM | POA: Diagnosis not present

## 2021-08-31 DIAGNOSIS — M9905 Segmental and somatic dysfunction of pelvic region: Secondary | ICD-10-CM | POA: Diagnosis not present

## 2021-08-31 DIAGNOSIS — Q72892 Other reduction defects of left lower limb: Secondary | ICD-10-CM | POA: Diagnosis not present

## 2021-08-31 DIAGNOSIS — M5431 Sciatica, right side: Secondary | ICD-10-CM | POA: Diagnosis not present

## 2021-08-31 DIAGNOSIS — M9904 Segmental and somatic dysfunction of sacral region: Secondary | ICD-10-CM | POA: Diagnosis not present

## 2021-08-31 DIAGNOSIS — M5136 Other intervertebral disc degeneration, lumbar region: Secondary | ICD-10-CM | POA: Diagnosis not present

## 2021-08-31 DIAGNOSIS — M5135 Other intervertebral disc degeneration, thoracolumbar region: Secondary | ICD-10-CM | POA: Diagnosis not present

## 2021-08-31 DIAGNOSIS — M9903 Segmental and somatic dysfunction of lumbar region: Secondary | ICD-10-CM | POA: Diagnosis not present

## 2021-08-31 NOTE — Progress Notes (Signed)
° °  Kelsey Nolan 07-Nov-1965 [id number]   History:  56 y.o. G0 presents for annual exam. Postmenopausal - no HRT, no bleeding. Normal pap history. Pre-diabetes, HTN managed by PCP.  Increased risk of breast cancer, lifetime risk of 25.8%.   Gynecologic History No LMP recorded. Patient is perimenopausal.   Contraception/Family planning: post menopausal status Sexually active: Yes  Health Maintenance Last Pap: 07/27/2018. Results were: Normal, 5-year repeat Last mammogram: 03/2021. Results were: Normal Last colonoscopy: 2018. Results were: Normal, 10-year recall Last Dexa: Not indicated  Past medical history, past surgical history, family history and social history were all reviewed and documented in the EPIC chart. 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   ROS:  A ROS was performed and pertinent positives and negatives are included.  Exam:  Vitals:   09/01/21 0932  BP: 122/80  Weight: 127 lb (57.6 kg)  Height: 5' 1.5" (1.562 m)   Body mass index is 23.61 kg/m.  General appearance:  Normal Thyroid:  Symmetrical, normal in size, without palpable masses or nodularity. Respiratory  Auscultation:  Clear without wheezing or rhonchi Cardiovascular  Auscultation:  Regular rate, without rubs, murmurs or gallops  Edema/varicosities:  Not grossly evident Abdominal  Soft,nontender, without masses, guarding or rebound.  Liver/spleen:  No organomegaly noted  Hernia:  None appreciated  Skin  Inspection:  Grossly normal Breasts: Examined lying and sitting.   Right: Without masses, retractions, nipple discharge or axillary adenopathy.   Left: Without masses, retractions, nipple discharge or axillary adenopathy. Genitourinary   Inguinal/mons:  Normal without inguinal adenopathy  External genitalia:  Normal appearing vulva with no masses, tenderness, or lesions  BUS/Urethra/Skene's glands:  Normal  Vagina:  Normal appearing with normal color and  discharge, no lesions  Cervix:  Normal appearing without discharge or lesions  Uterus:  Normal in size, shape and contour.  Midline and mobile, nontender  Adnexa/parametria:     Rt: Normal in size, without masses or tenderness.   Lt: Normal in size, without masses or tenderness.  Anus and perineum: Normal  Digital rectal exam: Normal sphincter tone without palpated masses or tenderness  Patient informed chaperone available to be present for breast and pelvic exam. Patient has requested no chaperone to be present. Patient has been advised what will be completed during breast and pelvic exam.   Assessment/Plan:  56 y.o. G0 for annual exam.   Well female exam with routine gynecological exam - Education provided on SBEs, importance of preventative screenings, current guidelines, high calcium diet, regular exercise, and multivitamin daily. Labs with PCP.   Postmenopausal - no HRT, no bleeding  At high risk for breast cancer - Mother, MGM, and paternal aunt with history of breast cancer. Lifetime risk of 25.8%. Annual MRI recommended and she is agreeable.   Screening for cervical cancer - Normal Pap history.  Will repeat at 5-year interval per guidelines.  Screening for breast cancer - Normal mammogram history.  Continue annual screenings.  Normal breast exam today.  Screening for colon cancer - 2018 colonoscopy. Will repeat at 10-year interval per GI's recommendation.   Screening for osteoporosis - Average risk. Will plan DXA further into menopause.   Return in 1 year for annual.     Tamela Gammon DNP, 9:59 AM 09/01/2021

## 2021-09-01 ENCOUNTER — Ambulatory Visit (INDEPENDENT_AMBULATORY_CARE_PROVIDER_SITE_OTHER): Payer: 59 | Admitting: Nurse Practitioner

## 2021-09-01 ENCOUNTER — Encounter: Payer: Self-pay | Admitting: Nurse Practitioner

## 2021-09-01 ENCOUNTER — Telehealth: Payer: Self-pay

## 2021-09-01 ENCOUNTER — Other Ambulatory Visit: Payer: Self-pay

## 2021-09-01 VITALS — BP 122/80 | Ht 61.5 in | Wt 127.0 lb

## 2021-09-01 DIAGNOSIS — Q72892 Other reduction defects of left lower limb: Secondary | ICD-10-CM | POA: Diagnosis not present

## 2021-09-01 DIAGNOSIS — M5135 Other intervertebral disc degeneration, thoracolumbar region: Secondary | ICD-10-CM | POA: Diagnosis not present

## 2021-09-01 DIAGNOSIS — M9905 Segmental and somatic dysfunction of pelvic region: Secondary | ICD-10-CM | POA: Diagnosis not present

## 2021-09-01 DIAGNOSIS — M9903 Segmental and somatic dysfunction of lumbar region: Secondary | ICD-10-CM | POA: Diagnosis not present

## 2021-09-01 DIAGNOSIS — M5431 Sciatica, right side: Secondary | ICD-10-CM | POA: Diagnosis not present

## 2021-09-01 DIAGNOSIS — Z01419 Encounter for gynecological examination (general) (routine) without abnormal findings: Secondary | ICD-10-CM

## 2021-09-01 DIAGNOSIS — M5136 Other intervertebral disc degeneration, lumbar region: Secondary | ICD-10-CM | POA: Diagnosis not present

## 2021-09-01 DIAGNOSIS — Z9189 Other specified personal risk factors, not elsewhere classified: Secondary | ICD-10-CM | POA: Diagnosis not present

## 2021-09-01 DIAGNOSIS — Z780 Asymptomatic menopausal state: Secondary | ICD-10-CM | POA: Diagnosis not present

## 2021-09-01 DIAGNOSIS — M9904 Segmental and somatic dysfunction of sacral region: Secondary | ICD-10-CM | POA: Diagnosis not present

## 2021-09-01 NOTE — Telephone Encounter (Signed)
Breast MRI Received: Today Olivia Mackie, NP  [facility] Triage Please send referral for breast MRI due to high risk of breast cancer. Lifetime risk of 25.8%. Thanks.

## 2021-09-01 NOTE — Telephone Encounter (Signed)
MRI order placed. Left message for patient to call. °

## 2021-09-03 DIAGNOSIS — M9905 Segmental and somatic dysfunction of pelvic region: Secondary | ICD-10-CM | POA: Diagnosis not present

## 2021-09-03 DIAGNOSIS — M9904 Segmental and somatic dysfunction of sacral region: Secondary | ICD-10-CM | POA: Diagnosis not present

## 2021-09-03 DIAGNOSIS — M5136 Other intervertebral disc degeneration, lumbar region: Secondary | ICD-10-CM | POA: Diagnosis not present

## 2021-09-03 DIAGNOSIS — M5135 Other intervertebral disc degeneration, thoracolumbar region: Secondary | ICD-10-CM | POA: Diagnosis not present

## 2021-09-03 DIAGNOSIS — M5431 Sciatica, right side: Secondary | ICD-10-CM | POA: Diagnosis not present

## 2021-09-03 DIAGNOSIS — M9903 Segmental and somatic dysfunction of lumbar region: Secondary | ICD-10-CM | POA: Diagnosis not present

## 2021-09-03 DIAGNOSIS — Q72892 Other reduction defects of left lower limb: Secondary | ICD-10-CM | POA: Diagnosis not present

## 2021-09-08 DIAGNOSIS — M5135 Other intervertebral disc degeneration, thoracolumbar region: Secondary | ICD-10-CM | POA: Diagnosis not present

## 2021-09-08 DIAGNOSIS — M5431 Sciatica, right side: Secondary | ICD-10-CM | POA: Diagnosis not present

## 2021-09-08 DIAGNOSIS — M9904 Segmental and somatic dysfunction of sacral region: Secondary | ICD-10-CM | POA: Diagnosis not present

## 2021-09-08 DIAGNOSIS — M9905 Segmental and somatic dysfunction of pelvic region: Secondary | ICD-10-CM | POA: Diagnosis not present

## 2021-09-08 DIAGNOSIS — Q72892 Other reduction defects of left lower limb: Secondary | ICD-10-CM | POA: Diagnosis not present

## 2021-09-08 DIAGNOSIS — M9903 Segmental and somatic dysfunction of lumbar region: Secondary | ICD-10-CM | POA: Diagnosis not present

## 2021-09-08 DIAGNOSIS — M5136 Other intervertebral disc degeneration, lumbar region: Secondary | ICD-10-CM | POA: Diagnosis not present

## 2021-09-10 DIAGNOSIS — Q72892 Other reduction defects of left lower limb: Secondary | ICD-10-CM | POA: Diagnosis not present

## 2021-09-10 DIAGNOSIS — M9904 Segmental and somatic dysfunction of sacral region: Secondary | ICD-10-CM | POA: Diagnosis not present

## 2021-09-10 DIAGNOSIS — M5135 Other intervertebral disc degeneration, thoracolumbar region: Secondary | ICD-10-CM | POA: Diagnosis not present

## 2021-09-10 DIAGNOSIS — M9903 Segmental and somatic dysfunction of lumbar region: Secondary | ICD-10-CM | POA: Diagnosis not present

## 2021-09-10 DIAGNOSIS — M9905 Segmental and somatic dysfunction of pelvic region: Secondary | ICD-10-CM | POA: Diagnosis not present

## 2021-09-10 DIAGNOSIS — M5136 Other intervertebral disc degeneration, lumbar region: Secondary | ICD-10-CM | POA: Diagnosis not present

## 2021-09-10 DIAGNOSIS — M5431 Sciatica, right side: Secondary | ICD-10-CM | POA: Diagnosis not present

## 2021-09-11 ENCOUNTER — Other Ambulatory Visit: Payer: 59

## 2021-09-15 DIAGNOSIS — Q72892 Other reduction defects of left lower limb: Secondary | ICD-10-CM | POA: Diagnosis not present

## 2021-09-15 DIAGNOSIS — M9904 Segmental and somatic dysfunction of sacral region: Secondary | ICD-10-CM | POA: Diagnosis not present

## 2021-09-15 DIAGNOSIS — M5136 Other intervertebral disc degeneration, lumbar region: Secondary | ICD-10-CM | POA: Diagnosis not present

## 2021-09-15 DIAGNOSIS — M5431 Sciatica, right side: Secondary | ICD-10-CM | POA: Diagnosis not present

## 2021-09-15 DIAGNOSIS — M5135 Other intervertebral disc degeneration, thoracolumbar region: Secondary | ICD-10-CM | POA: Diagnosis not present

## 2021-09-15 DIAGNOSIS — M9905 Segmental and somatic dysfunction of pelvic region: Secondary | ICD-10-CM | POA: Diagnosis not present

## 2021-09-15 DIAGNOSIS — M9903 Segmental and somatic dysfunction of lumbar region: Secondary | ICD-10-CM | POA: Diagnosis not present

## 2021-09-17 DIAGNOSIS — M5136 Other intervertebral disc degeneration, lumbar region: Secondary | ICD-10-CM | POA: Diagnosis not present

## 2021-09-17 DIAGNOSIS — M9905 Segmental and somatic dysfunction of pelvic region: Secondary | ICD-10-CM | POA: Diagnosis not present

## 2021-09-17 DIAGNOSIS — M9904 Segmental and somatic dysfunction of sacral region: Secondary | ICD-10-CM | POA: Diagnosis not present

## 2021-09-17 DIAGNOSIS — M5135 Other intervertebral disc degeneration, thoracolumbar region: Secondary | ICD-10-CM | POA: Diagnosis not present

## 2021-09-17 DIAGNOSIS — M9903 Segmental and somatic dysfunction of lumbar region: Secondary | ICD-10-CM | POA: Diagnosis not present

## 2021-09-17 DIAGNOSIS — M5431 Sciatica, right side: Secondary | ICD-10-CM | POA: Diagnosis not present

## 2021-09-17 DIAGNOSIS — Q72892 Other reduction defects of left lower limb: Secondary | ICD-10-CM | POA: Diagnosis not present

## 2021-09-21 DIAGNOSIS — M9903 Segmental and somatic dysfunction of lumbar region: Secondary | ICD-10-CM | POA: Diagnosis not present

## 2021-09-21 DIAGNOSIS — M9904 Segmental and somatic dysfunction of sacral region: Secondary | ICD-10-CM | POA: Diagnosis not present

## 2021-09-21 DIAGNOSIS — M5136 Other intervertebral disc degeneration, lumbar region: Secondary | ICD-10-CM | POA: Diagnosis not present

## 2021-09-21 DIAGNOSIS — M9905 Segmental and somatic dysfunction of pelvic region: Secondary | ICD-10-CM | POA: Diagnosis not present

## 2021-09-21 DIAGNOSIS — Q72892 Other reduction defects of left lower limb: Secondary | ICD-10-CM | POA: Diagnosis not present

## 2021-09-21 DIAGNOSIS — M5431 Sciatica, right side: Secondary | ICD-10-CM | POA: Diagnosis not present

## 2021-09-21 DIAGNOSIS — M5135 Other intervertebral disc degeneration, thoracolumbar region: Secondary | ICD-10-CM | POA: Diagnosis not present

## 2021-09-22 ENCOUNTER — Other Ambulatory Visit: Payer: Self-pay

## 2021-09-22 ENCOUNTER — Ambulatory Visit
Admission: RE | Admit: 2021-09-22 | Discharge: 2021-09-22 | Disposition: A | Discharge status: Home / Self Care | Payer: 59 | Source: Ambulatory Visit | Attending: Nurse Practitioner | Admitting: Nurse Practitioner

## 2021-09-22 DIAGNOSIS — Z9189 Other specified personal risk factors, not elsewhere classified: Secondary | ICD-10-CM

## 2021-09-22 MED ORDER — GADOBUTROL 1 MMOL/ML IV SOLN
6.0000 mL | Freq: Once | INTRAVENOUS | Status: AC | PRN
  Administered 2021-09-22: 6 mL via INTRAVENOUS

## 2021-09-23 ENCOUNTER — Other Ambulatory Visit: Payer: Self-pay | Admitting: Nurse Practitioner

## 2021-09-23 ENCOUNTER — Telehealth: Payer: Self-pay | Admitting: *Deleted

## 2021-09-23 DIAGNOSIS — R9389 Abnormal findings on diagnostic imaging of other specified body structures: Secondary | ICD-10-CM

## 2021-09-23 NOTE — Telephone Encounter (Signed)
Patient called requesting MRI breast results from yesterday. Patient was called back for biopsy. Please advise  ?

## 2021-09-23 NOTE — Telephone Encounter (Signed)
IMPRESSION: ?1. Indeterminate 0.6 cm posterior central RIGHT breast mass. Due to ?location and small size, this would be difficult to definitively ?identify sonographically. MRI guided biopsy is recommended. ?2. No abnormal appearing lymph nodes. ?3. No MR evidence of LEFT breast malignancy. ? ?A biopsy is recommended for further evaluation.  ?

## 2021-09-23 NOTE — Telephone Encounter (Signed)
Left message for patient to call.

## 2021-09-24 DIAGNOSIS — M9903 Segmental and somatic dysfunction of lumbar region: Secondary | ICD-10-CM | POA: Diagnosis not present

## 2021-09-24 DIAGNOSIS — Q72892 Other reduction defects of left lower limb: Secondary | ICD-10-CM | POA: Diagnosis not present

## 2021-09-24 DIAGNOSIS — M9904 Segmental and somatic dysfunction of sacral region: Secondary | ICD-10-CM | POA: Diagnosis not present

## 2021-09-24 DIAGNOSIS — M5431 Sciatica, right side: Secondary | ICD-10-CM | POA: Diagnosis not present

## 2021-09-24 DIAGNOSIS — M9905 Segmental and somatic dysfunction of pelvic region: Secondary | ICD-10-CM | POA: Diagnosis not present

## 2021-09-24 DIAGNOSIS — M5136 Other intervertebral disc degeneration, lumbar region: Secondary | ICD-10-CM | POA: Diagnosis not present

## 2021-09-24 DIAGNOSIS — M5135 Other intervertebral disc degeneration, thoracolumbar region: Secondary | ICD-10-CM | POA: Diagnosis not present

## 2021-09-29 NOTE — Telephone Encounter (Signed)
Left message for patient to call.

## 2021-10-01 ENCOUNTER — Ambulatory Visit
Admission: RE | Admit: 2021-10-01 | Discharge: 2021-10-01 | Disposition: A | Discharge status: Home / Self Care | Payer: 59 | Source: Ambulatory Visit | Attending: Nurse Practitioner | Admitting: Nurse Practitioner

## 2021-10-01 ENCOUNTER — Ambulatory Visit: Admission: RE | Admit: 2021-10-01 | Payer: 59 | Source: Ambulatory Visit

## 2021-10-01 ENCOUNTER — Other Ambulatory Visit: Payer: Self-pay

## 2021-10-07 NOTE — Telephone Encounter (Signed)
Patient never called back scheduled for biopsy on 10/09/21 ?

## 2021-10-09 ENCOUNTER — Other Ambulatory Visit: Payer: Self-pay

## 2021-10-09 ENCOUNTER — Ambulatory Visit
Admission: RE | Admit: 2021-10-09 | Discharge: 2021-10-09 | Disposition: A | Discharge status: Home / Self Care | Payer: 59 | Source: Ambulatory Visit | Attending: Nurse Practitioner | Admitting: Nurse Practitioner

## 2021-10-09 DIAGNOSIS — R928 Other abnormal and inconclusive findings on diagnostic imaging of breast: Secondary | ICD-10-CM | POA: Diagnosis not present

## 2021-10-09 DIAGNOSIS — R9389 Abnormal findings on diagnostic imaging of other specified body structures: Secondary | ICD-10-CM

## 2021-10-09 DIAGNOSIS — N6310 Unspecified lump in the right breast, unspecified quadrant: Secondary | ICD-10-CM | POA: Diagnosis not present

## 2021-10-09 MED ORDER — GADOBUTROL 1 MMOL/ML IV SOLN
6.0000 mL | Freq: Once | INTRAVENOUS | Status: AC | PRN
  Administered 2021-10-09: 6 mL via INTRAVENOUS

## 2022-04-15 DIAGNOSIS — Z1231 Encounter for screening mammogram for malignant neoplasm of breast: Secondary | ICD-10-CM | POA: Diagnosis not present

## 2022-04-19 ENCOUNTER — Encounter: Payer: Self-pay | Admitting: Nurse Practitioner

## 2022-04-21 DIAGNOSIS — R928 Other abnormal and inconclusive findings on diagnostic imaging of breast: Secondary | ICD-10-CM | POA: Diagnosis not present

## 2022-04-21 DIAGNOSIS — R922 Inconclusive mammogram: Secondary | ICD-10-CM | POA: Diagnosis not present

## 2022-05-11 DIAGNOSIS — H16041 Marginal corneal ulcer, right eye: Secondary | ICD-10-CM | POA: Diagnosis not present

## 2022-05-12 DIAGNOSIS — H16041 Marginal corneal ulcer, right eye: Secondary | ICD-10-CM | POA: Diagnosis not present

## 2022-05-17 DIAGNOSIS — H16041 Marginal corneal ulcer, right eye: Secondary | ICD-10-CM | POA: Diagnosis not present

## 2022-07-30 DIAGNOSIS — E7800 Pure hypercholesterolemia, unspecified: Secondary | ICD-10-CM | POA: Diagnosis not present

## 2022-07-30 DIAGNOSIS — R7303 Prediabetes: Secondary | ICD-10-CM | POA: Diagnosis not present

## 2022-07-30 DIAGNOSIS — I10 Essential (primary) hypertension: Secondary | ICD-10-CM | POA: Diagnosis not present

## 2022-07-30 DIAGNOSIS — K219 Gastro-esophageal reflux disease without esophagitis: Secondary | ICD-10-CM | POA: Diagnosis not present

## 2022-08-31 ENCOUNTER — Encounter: Payer: Self-pay | Admitting: Nurse Practitioner

## 2022-09-01 ENCOUNTER — Encounter: Payer: Self-pay | Admitting: Nurse Practitioner

## 2022-09-02 ENCOUNTER — Encounter: Payer: Self-pay | Admitting: Nurse Practitioner

## 2022-09-02 ENCOUNTER — Ambulatory Visit (INDEPENDENT_AMBULATORY_CARE_PROVIDER_SITE_OTHER): Payer: BC Managed Care – PPO | Admitting: Nurse Practitioner

## 2022-09-02 VITALS — BP 104/62 | HR 55 | Ht 62.0 in | Wt 124.0 lb

## 2022-09-02 DIAGNOSIS — Z780 Asymptomatic menopausal state: Secondary | ICD-10-CM

## 2022-09-02 DIAGNOSIS — Z01419 Encounter for gynecological examination (general) (routine) without abnormal findings: Secondary | ICD-10-CM | POA: Diagnosis not present

## 2022-09-02 DIAGNOSIS — Z9189 Other specified personal risk factors, not elsewhere classified: Secondary | ICD-10-CM

## 2022-09-02 NOTE — Progress Notes (Signed)
   Kelsey Nolan 19-Feb-1966 [id number]   History:  57 y.o. G0 presents for annual exam. Postmenopausal - no HRT, no bleeding. Normal pap history. Pre-diabetes, HTN managed by PCP.  Increased risk of breast cancer, lifetime risk of 25.8%. Benign right breast biopsy 09/2021.   Gynecologic History Patient's last menstrual period was 07/11/2018.   Contraception/Family planning: post menopausal status Sexually active: Yes  Health Maintenance Last Pap: 07/27/2018. Results were: Normal, 5-year repeat Last mammogram: 04/15/2022. Results were: Possible right breast asymmetry, follow up normal Last colonoscopy: 2018. Results were: Normal, 10-year recall Last Dexa: Not indicated  Past medical history, past surgical history, family history and social history were all reviewed and documented in the EPIC chart. 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   ROS:  A ROS was performed and pertinent positives and negatives are included.  Exam:  Vitals:   09/02/22 0929  BP: 104/62  Pulse: (!) 55  SpO2: 98%  Weight: 124 lb (56.2 kg)  Height: 5\' 2"  (1.575 m)    Body mass index is 22.68 kg/m.  General appearance:  Normal Thyroid:  Symmetrical, normal in size, without palpable masses or nodularity. Respiratory  Auscultation:  Clear without wheezing or rhonchi Cardiovascular  Auscultation:  Regular rate, without rubs, murmurs or gallops  Edema/varicosities:  Not grossly evident Abdominal  Soft,nontender, without masses, guarding or rebound.  Liver/spleen:  No organomegaly noted  Hernia:  None appreciated  Skin  Inspection:  Grossly normal Breasts: Examined lying and sitting.   Right: Without masses, retractions, nipple discharge or axillary adenopathy.   Left: Without masses, retractions, nipple discharge or axillary adenopathy. Genitourinary   Inguinal/mons:  Normal without inguinal adenopathy  External genitalia:  Normal appearing vulva with no  masses, tenderness, or lesions  BUS/Urethra/Skene's glands:  Normal  Vagina:  Normal appearing with normal color and discharge, no lesions. Atrophic changes  Cervix:  Normal appearing without discharge or lesions  Uterus:  Normal in size, shape and contour.  Midline and mobile, nontender  Adnexa/parametria:     Rt: Normal in size, without masses or tenderness.   Lt: Normal in size, without masses or tenderness.  Anus and perineum: Normal  Digital rectal exam: Deferred  Patient informed chaperone available to be present for breast and pelvic exam. Patient has requested no chaperone to be present. Patient has been advised what will be completed during breast and pelvic exam.   Assessment/Plan:  57 y.o. G0 for annual exam.   Well female exam with routine gynecological exam - Education provided on SBEs, importance of preventative screenings, current guidelines, high calcium diet, regular exercise, and multivitamin daily. Labs with PCP.   Postmenopausal - no HRT, no bleeding  At increased risk of breast cancer - Plan: Ambulatory referral to Genetics. Mother, MGM, and paternal aunt with history of breast cancer. Lifetime risk of 25.8%. Doing annual MRIs, due in March and plans to schedule this soon. Mammogram 03/2022 - possible right breast asymmetry, follow up imaging normal.  Screening for cervical cancer - Normal Pap history.  Will repeat at 5-year interval per guidelines.  Screening for colon cancer - 2018 colonoscopy. Will repeat at 10-year interval per GI's recommendation.   Screening for osteoporosis - Average risk. Will plan DXA further into menopause.   Return in 1 year for annual.     Tamela Gammon DNP, 9:42 AM 09/02/2022

## 2022-09-06 ENCOUNTER — Other Ambulatory Visit: Payer: Self-pay | Admitting: Nurse Practitioner

## 2022-09-06 DIAGNOSIS — Z9189 Other specified personal risk factors, not elsewhere classified: Secondary | ICD-10-CM

## 2022-09-13 ENCOUNTER — Telehealth: Payer: Self-pay | Admitting: Genetic Counselor

## 2022-09-13 IMAGING — MG MM BREAST LOCALIZATION CLIP
4 series · 4 of 12 positions shown · non-contrast
Comparison: Previous exam(s).

CLINICAL DATA: Status post MR guided core biopsy of a right breast
mass.

EXAM:
3D DIAGNOSTIC RIGHT MAMMOGRAM POST MRI BIOPSY

[R CC synth-2D]
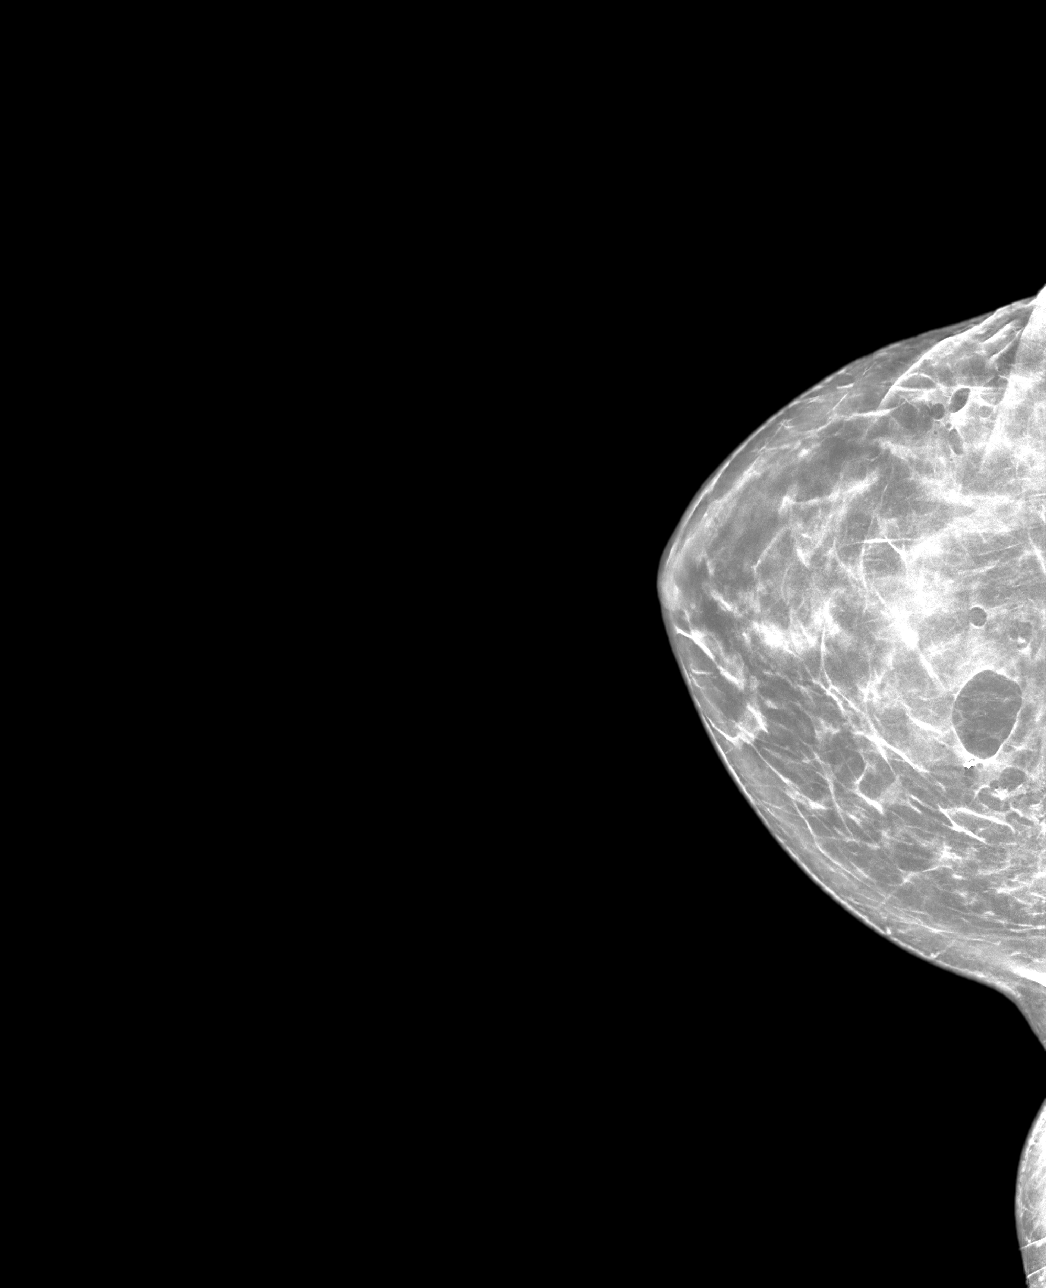

[R ML synth-2D]
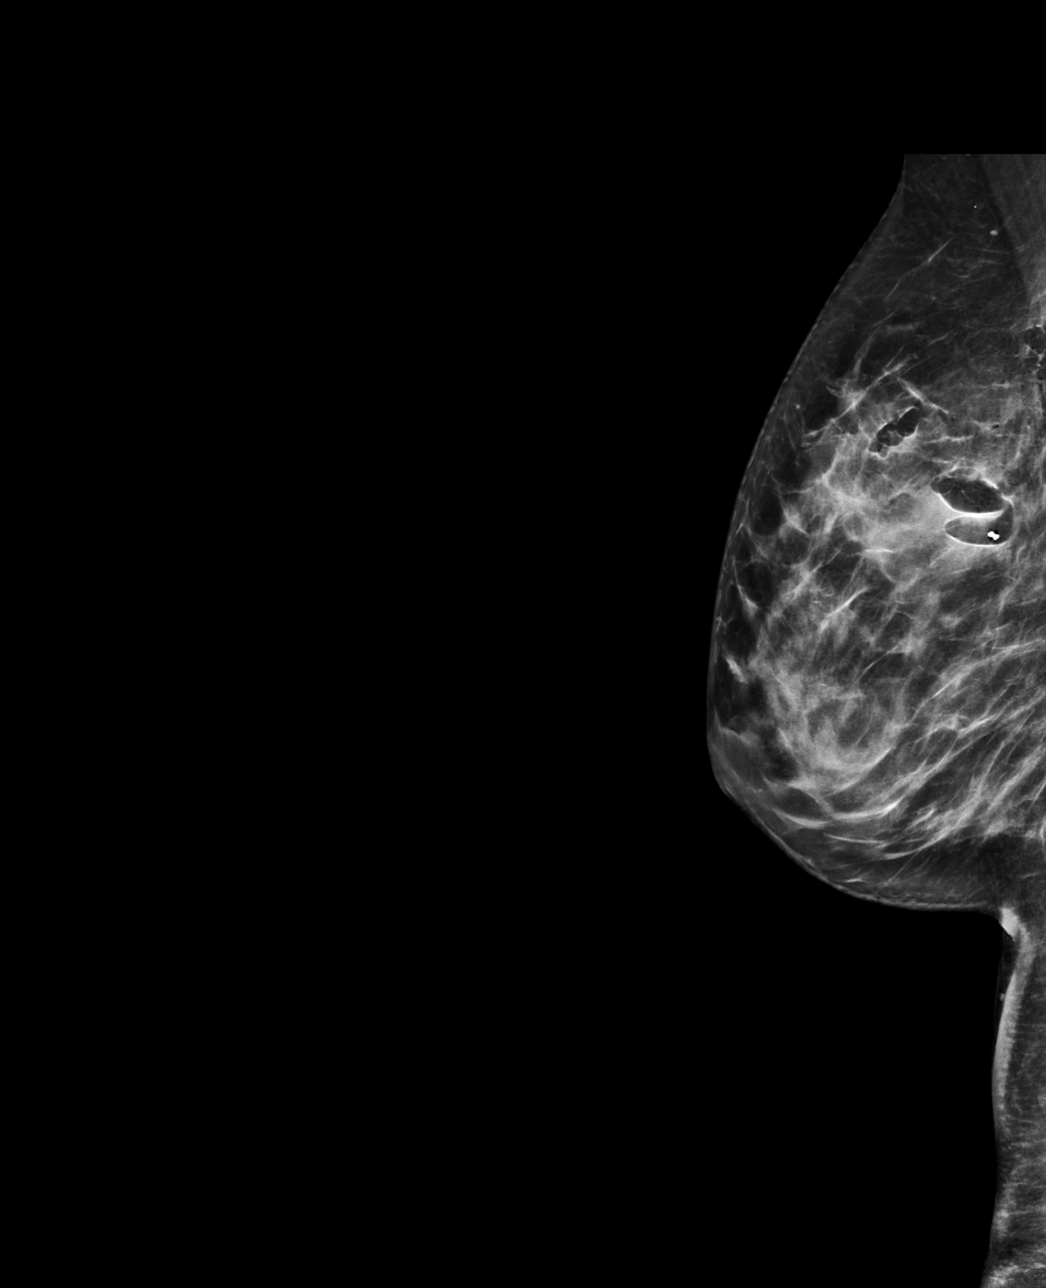

[R CC tomo · tomo slice 36/71.0]
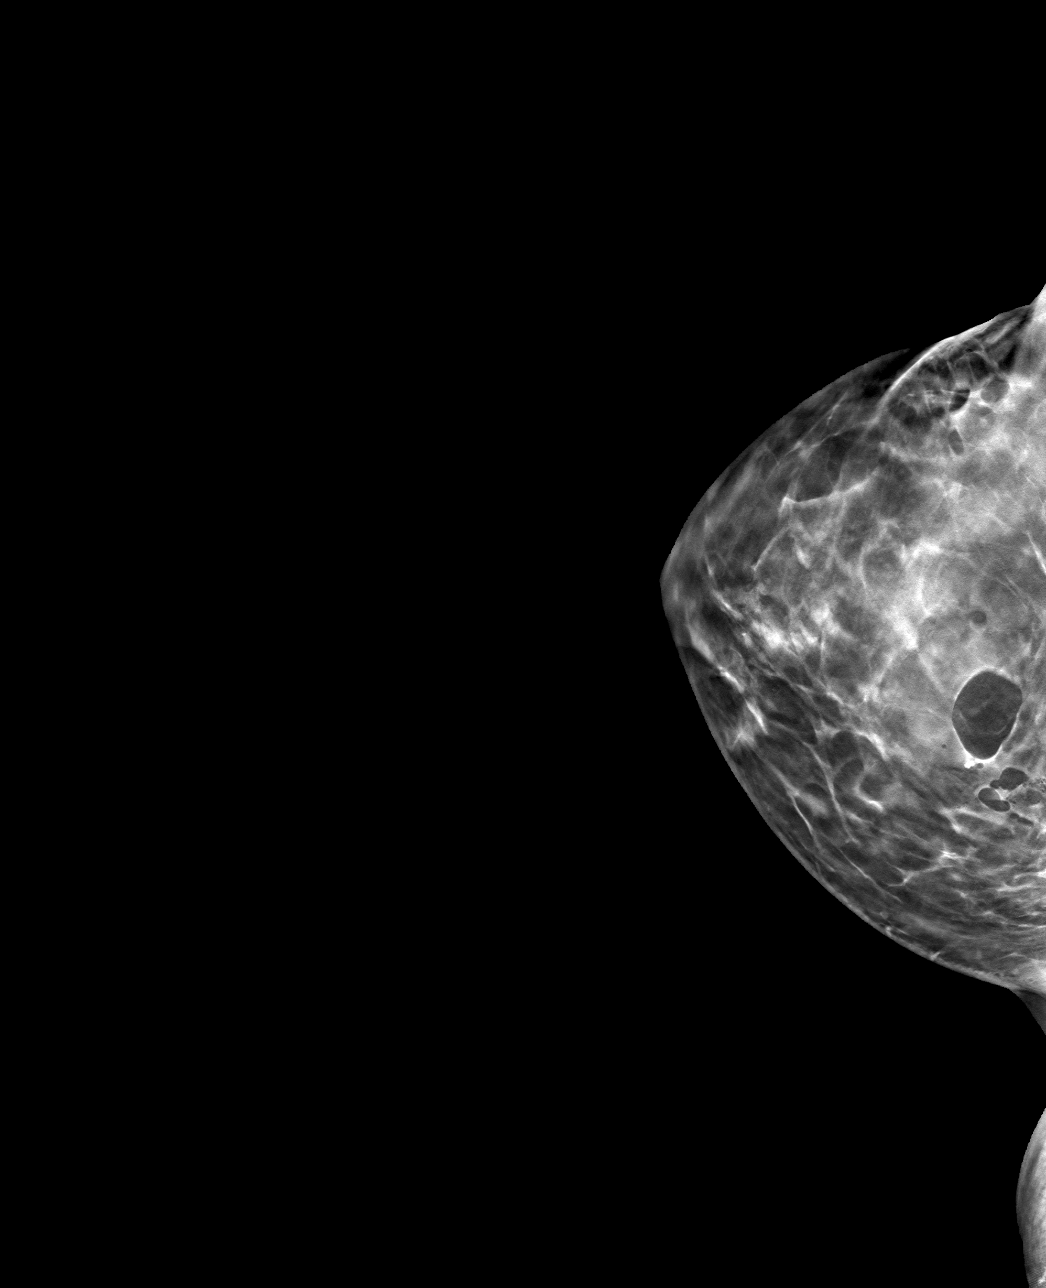

[R ML tomo · tomo slice 35/69.0]
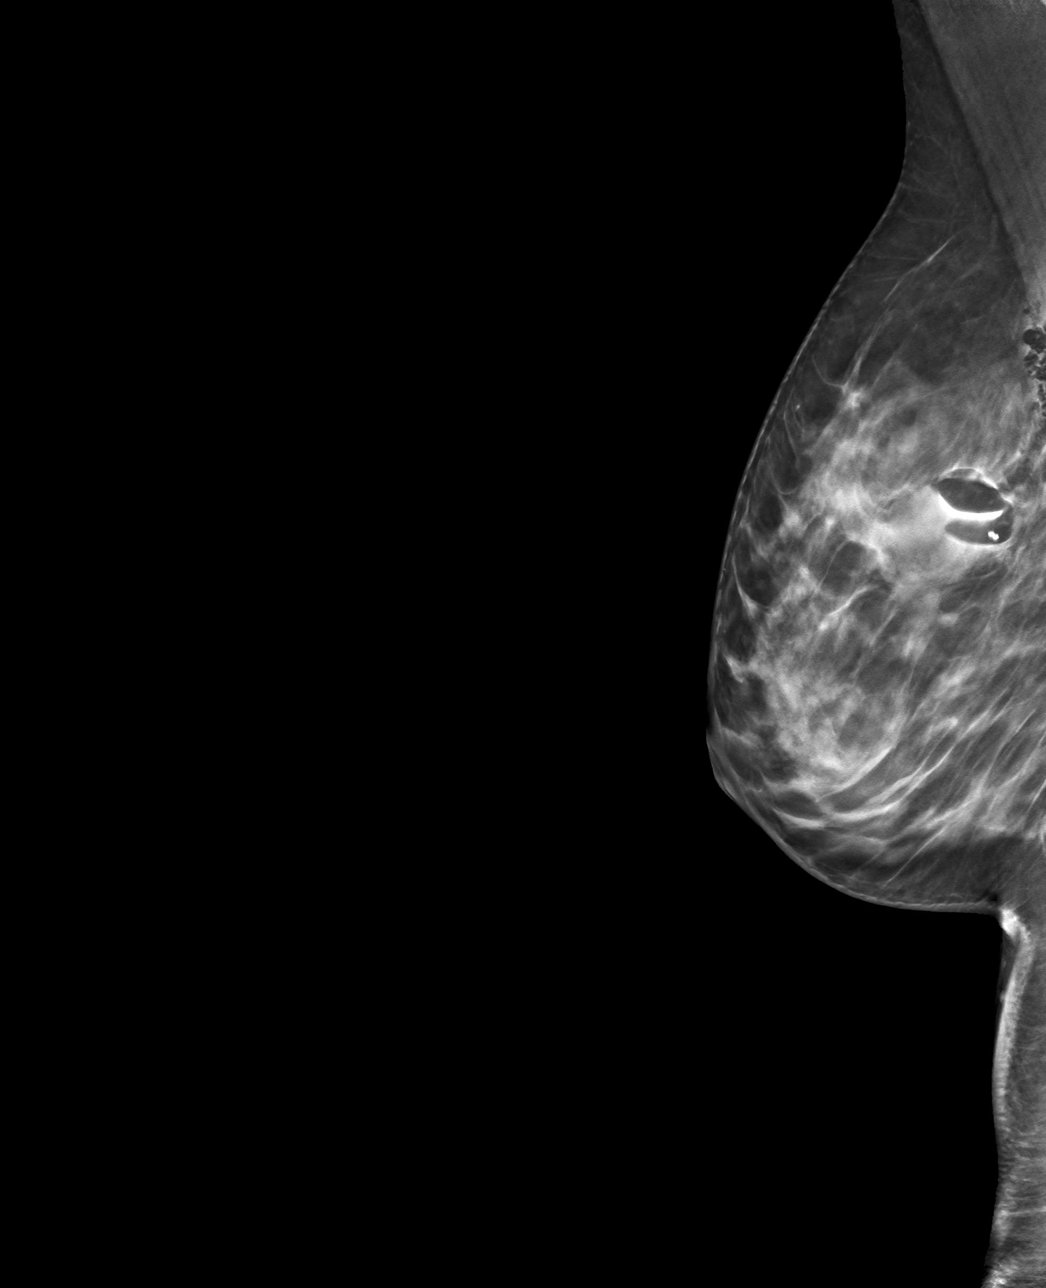

[4 of 12 positions shown; findings below may reference images not displayed]

FINDINGS: 3D Mammographic images were obtained following MR guided biopsy of a
right breast mass. The biopsy marker clip in expected location in
the upper aspect of the right breast.
IMPRESSION: Status post MR guided core biopsy of a mass in the right breast. The
dumbbell-shaped clip is in the upper aspect of the right breast in
appropriate position.

Final Assessment: Post Procedure Mammograms for Marker Placement

## 2022-09-13 NOTE — Telephone Encounter (Signed)
Scheduled appt per 2/15 referral. Pt is aware of appt date and time. Pt is aware to arrive 15 mins prior to appt time and to bring and updated insurance card. Pt is aware of appt location.   °

## 2022-09-19 ENCOUNTER — Ambulatory Visit
Admission: RE | Admit: 2022-09-19 | Discharge: 2022-09-19 | Disposition: A | Discharge status: Home / Self Care | Payer: BC Managed Care – PPO | Source: Ambulatory Visit | Attending: Nurse Practitioner | Admitting: Nurse Practitioner

## 2022-09-19 DIAGNOSIS — Z1239 Encounter for other screening for malignant neoplasm of breast: Secondary | ICD-10-CM | POA: Diagnosis not present

## 2022-09-19 DIAGNOSIS — Z9189 Other specified personal risk factors, not elsewhere classified: Secondary | ICD-10-CM

## 2022-09-19 MED ORDER — GADOPICLENOL 0.5 MMOL/ML IV SOLN
6.0000 mL | Freq: Once | INTRAVENOUS | Status: AC | PRN
  Administered 2022-09-19: 6 mL via INTRAVENOUS

## 2022-09-20 ENCOUNTER — Other Ambulatory Visit: Payer: Self-pay | Admitting: Nurse Practitioner

## 2022-09-20 DIAGNOSIS — R928 Other abnormal and inconclusive findings on diagnostic imaging of breast: Secondary | ICD-10-CM

## 2022-09-23 ENCOUNTER — Ambulatory Visit
Admission: RE | Admit: 2022-09-23 | Discharge: 2022-09-23 | Disposition: A | Discharge status: Home / Self Care | Payer: BC Managed Care – PPO | Source: Ambulatory Visit | Attending: Nurse Practitioner | Admitting: Nurse Practitioner

## 2022-09-23 DIAGNOSIS — N6011 Diffuse cystic mastopathy of right breast: Secondary | ICD-10-CM | POA: Diagnosis not present

## 2022-09-23 DIAGNOSIS — R928 Other abnormal and inconclusive findings on diagnostic imaging of breast: Secondary | ICD-10-CM

## 2022-09-23 MED ORDER — GADOPICLENOL 0.5 MMOL/ML IV SOLN
6.0000 mL | Freq: Once | INTRAVENOUS | Status: AC | PRN
  Administered 2022-09-23: 6 mL via INTRAVENOUS

## 2022-11-03 ENCOUNTER — Telehealth: Payer: Self-pay | Admitting: Genetic Counselor

## 2022-11-03 NOTE — Telephone Encounter (Signed)
Patient called to cancel 4/18 appointment. Stating not something she wants to pursue at this time. Patient will call back or have another referral sent when ready to pursue.

## 2022-11-04 ENCOUNTER — Inpatient Hospital Stay: Payer: BC Managed Care – PPO | Admitting: Genetic Counselor

## 2022-11-04 ENCOUNTER — Inpatient Hospital Stay: Payer: BC Managed Care – PPO

## 2023-02-18 DIAGNOSIS — I10 Essential (primary) hypertension: Secondary | ICD-10-CM | POA: Diagnosis not present

## 2023-02-18 DIAGNOSIS — R7303 Prediabetes: Secondary | ICD-10-CM | POA: Diagnosis not present

## 2023-02-18 DIAGNOSIS — E7800 Pure hypercholesterolemia, unspecified: Secondary | ICD-10-CM | POA: Diagnosis not present

## 2023-02-18 DIAGNOSIS — K219 Gastro-esophageal reflux disease without esophagitis: Secondary | ICD-10-CM | POA: Diagnosis not present

## 2023-02-18 DIAGNOSIS — Z0000 Encounter for general adult medical examination without abnormal findings: Secondary | ICD-10-CM | POA: Diagnosis not present

## 2023-02-23 DIAGNOSIS — K8020 Calculus of gallbladder without cholecystitis without obstruction: Secondary | ICD-10-CM | POA: Diagnosis not present

## 2023-03-11 DIAGNOSIS — H1789 Other corneal scars and opacities: Secondary | ICD-10-CM | POA: Diagnosis not present

## 2023-03-11 DIAGNOSIS — H04121 Dry eye syndrome of right lacrimal gland: Secondary | ICD-10-CM | POA: Diagnosis not present

## 2023-03-11 DIAGNOSIS — H52203 Unspecified astigmatism, bilateral: Secondary | ICD-10-CM | POA: Diagnosis not present

## 2023-03-11 DIAGNOSIS — H5213 Myopia, bilateral: Secondary | ICD-10-CM | POA: Diagnosis not present

## 2023-03-14 DIAGNOSIS — K8010 Calculus of gallbladder with chronic cholecystitis without obstruction: Secondary | ICD-10-CM | POA: Diagnosis not present

## 2023-04-21 DIAGNOSIS — R0981 Nasal congestion: Secondary | ICD-10-CM | POA: Diagnosis not present

## 2023-04-21 DIAGNOSIS — J019 Acute sinusitis, unspecified: Secondary | ICD-10-CM | POA: Diagnosis not present

## 2023-04-21 DIAGNOSIS — Z03818 Encounter for observation for suspected exposure to other biological agents ruled out: Secondary | ICD-10-CM | POA: Diagnosis not present

## 2023-05-04 DIAGNOSIS — Z1231 Encounter for screening mammogram for malignant neoplasm of breast: Secondary | ICD-10-CM | POA: Diagnosis not present

## 2023-05-24 DIAGNOSIS — D2261 Melanocytic nevi of right upper limb, including shoulder: Secondary | ICD-10-CM | POA: Diagnosis not present

## 2023-05-24 DIAGNOSIS — L821 Other seborrheic keratosis: Secondary | ICD-10-CM | POA: Diagnosis not present

## 2023-05-24 DIAGNOSIS — D2262 Melanocytic nevi of left upper limb, including shoulder: Secondary | ICD-10-CM | POA: Diagnosis not present

## 2023-05-24 DIAGNOSIS — D224 Melanocytic nevi of scalp and neck: Secondary | ICD-10-CM | POA: Diagnosis not present

## 2023-09-13 DIAGNOSIS — R7303 Prediabetes: Secondary | ICD-10-CM | POA: Diagnosis not present

## 2023-09-13 DIAGNOSIS — E7800 Pure hypercholesterolemia, unspecified: Secondary | ICD-10-CM | POA: Diagnosis not present

## 2023-09-13 DIAGNOSIS — I10 Essential (primary) hypertension: Secondary | ICD-10-CM | POA: Diagnosis not present

## 2023-09-13 DIAGNOSIS — K219 Gastro-esophageal reflux disease without esophagitis: Secondary | ICD-10-CM | POA: Diagnosis not present

## 2023-09-21 ENCOUNTER — Ambulatory Visit: Payer: BC Managed Care – PPO | Admitting: Nurse Practitioner

## 2023-11-17 ENCOUNTER — Other Ambulatory Visit (HOSPITAL_COMMUNITY)
Admission: RE | Admit: 2023-11-17 | Discharge: 2023-11-17 | Disposition: A | Discharge status: Home / Self Care | Source: Ambulatory Visit | Attending: Nurse Practitioner | Admitting: Nurse Practitioner

## 2023-11-17 ENCOUNTER — Ambulatory Visit (INDEPENDENT_AMBULATORY_CARE_PROVIDER_SITE_OTHER): Admitting: Nurse Practitioner

## 2023-11-17 ENCOUNTER — Encounter: Payer: Self-pay | Admitting: Nurse Practitioner

## 2023-11-17 ENCOUNTER — Telehealth: Payer: Self-pay | Admitting: *Deleted

## 2023-11-17 VITALS — BP 124/74 | HR 56 | Ht 61.75 in | Wt 120.0 lb

## 2023-11-17 DIAGNOSIS — Z780 Asymptomatic menopausal state: Secondary | ICD-10-CM | POA: Diagnosis not present

## 2023-11-17 DIAGNOSIS — Z124 Encounter for screening for malignant neoplasm of cervix: Secondary | ICD-10-CM | POA: Insufficient documentation

## 2023-11-17 DIAGNOSIS — Z9189 Other specified personal risk factors, not elsewhere classified: Secondary | ICD-10-CM

## 2023-11-17 DIAGNOSIS — Z1331 Encounter for screening for depression: Secondary | ICD-10-CM | POA: Diagnosis not present

## 2023-11-17 DIAGNOSIS — Z01419 Encounter for gynecological examination (general) (routine) without abnormal findings: Secondary | ICD-10-CM

## 2023-11-17 NOTE — Telephone Encounter (Signed)
-----   Message from Andee Bamberger sent at 11/17/2023 10:37 AM EDT ----- Regarding: breast MRI Please send order for breast MRI for increased risk for breast cancer. Thanks.

## 2023-11-17 NOTE — Progress Notes (Signed)
 Kelsey Nolan January 15, 1966 [id number]   History:  58 y.o. G0 presents for annual exam. Postmenopausal - no HRT, no bleeding. Normal pap history. Pre-diabetes, HTN managed by PCP.  Increased risk of breast cancer, lifetime risk of 25.8%. Benign right breast biopsy March 2023 and March 2024.  Gynecologic History Patient's last menstrual period was 07/11/2018.   Contraception/Family planning: post menopausal status Sexually active: Yes  Health Maintenance Last Pap: 07/27/2018. Results were: Normal, neg HPV Last breast MRI: 09/19/2022. Results were: Right mass like enhancement, Biopsy - BENIGN BREAST WITH FIBROCYSTIC CHANGES INCLUDING STROMAL FIBROSIS, CYSTIC DILATATION OF DUCTS, APOCRINE METAPLASIA AND ADENOSIS FOCAL DUCT ECTASIA NEGATIVE FOR MICROCALCIFICATIONS NEGATIVE FOR ATYPIA AND CARCINOMA Mammogram 03/2023. Results were: Normal Last colonoscopy: 2018. Results were: Normal, 10-year recall Last Dexa: Not indicated     11/17/2023   10:00 AM  Depression screen PHQ 2/9  Decreased Interest 0  Down, Depressed, Hopeless 0  PHQ - 2 Score 0     Past medical history, past surgical history, family history and social history were all reviewed and documented in the EPIC chart. 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   ROS:  A ROS was performed and pertinent positives and negatives are included.  Exam:  Vitals:   11/17/23 0958  BP: 124/74  Pulse: (!) 56  SpO2: 100%  Weight: 120 lb (54.4 kg)  Height: 5' 1.75" (1.568 m)     Body mass index is 22.13 kg/m.  General appearance:  Normal Thyroid :  Symmetrical, normal in size, without palpable masses or nodularity. Respiratory  Auscultation:  Clear without wheezing or rhonchi Cardiovascular  Auscultation:  Regular rate, without rubs, murmurs or gallops  Edema/varicosities:  Not grossly evident Abdominal  Soft,nontender, without masses, guarding or  rebound.  Liver/spleen:  No organomegaly noted  Hernia:  None appreciated  Skin  Inspection:  Grossly normal Breasts: Examined lying and sitting.   Right: Without masses, retractions, nipple discharge or axillary adenopathy.   Left: Without masses, retractions, nipple discharge or axillary adenopathy. Pelvic: External genitalia:  no lesions              Urethra:  normal appearing urethra with no masses, tenderness or lesions              Bartholins and Skenes: normal                 Vagina: normal appearing vagina with normal color and discharge, no lesions. Atrophic changes              Cervix: no lesions Bimanual Exam:  Uterus:  no masses or tenderness              Adnexa: no mass, fullness, tenderness              Rectovaginal: Deferred              Anus:  normal, no lesions  Patient informed chaperone available to be present for breast and pelvic exam. Patient has requested no chaperone to be present. Patient has been advised what will be completed during breast and pelvic exam.   Assessment/Plan:  58 y.o. G0 for annual exam.   Well female exam with routine gynecological exam - Education provided on SBEs, importance of preventative screenings, current guidelines, high calcium diet, regular exercise, and multivitamin daily. Labs with PCP.   Postmenopausal - no HRT, no bleeding  At increased risk of  breast cancer - Not interested in genetic counseling at this time. Mother, MGM, and paternal aunt with history of breast cancer. Lifetime risk of 25.8%. Doing annual MRIs and mammograms.  Cervical cancer screening - Plan: Cytology - PAP( [facility]) - Normal pap history.   Screening for colon cancer - 2018 colonoscopy. Will repeat at 10-year interval per GI's recommendation.   Screening for osteoporosis - Average risk. Will plan DXA further into menopause.   Return in about 1 year (around 11/16/2024) for Annual.    Andee Bamberger DNP, 10:37 AM 11/17/2023

## 2023-11-17 NOTE — Telephone Encounter (Signed)
 MRI ordered.   MyChart message to patient.

## 2023-11-18 LAB — CYTOLOGY - PAP
Comment: NEGATIVE
Diagnosis: NEGATIVE
High risk HPV: NEGATIVE

## 2023-11-18 NOTE — Telephone Encounter (Signed)
 MRI scheduled for 12/26/2023.

## 2023-11-21 ENCOUNTER — Encounter: Payer: Self-pay | Admitting: Nurse Practitioner

## 2023-12-26 ENCOUNTER — Ambulatory Visit
Admission: RE | Admit: 2023-12-26 | Discharge: 2023-12-26 | Disposition: A | Discharge status: Home / Self Care | Source: Ambulatory Visit | Attending: Nurse Practitioner

## 2023-12-26 DIAGNOSIS — Z9189 Other specified personal risk factors, not elsewhere classified: Secondary | ICD-10-CM

## 2023-12-26 DIAGNOSIS — R928 Other abnormal and inconclusive findings on diagnostic imaging of breast: Secondary | ICD-10-CM | POA: Diagnosis not present

## 2023-12-26 MED ORDER — GADOPICLENOL 0.5 MMOL/ML IV SOLN
5.0000 mL | Freq: Once | INTRAVENOUS | Status: AC | PRN
  Administered 2023-12-26: 5 mL via INTRAVENOUS

## 2023-12-28 ENCOUNTER — Other Ambulatory Visit: Payer: Self-pay | Admitting: Nurse Practitioner

## 2023-12-28 DIAGNOSIS — R928 Other abnormal and inconclusive findings on diagnostic imaging of breast: Secondary | ICD-10-CM

## 2024-01-12 ENCOUNTER — Ambulatory Visit
Admission: RE | Admit: 2024-01-12 | Discharge: 2024-01-12 | Disposition: A | Discharge status: Home / Self Care | Source: Ambulatory Visit | Attending: Nurse Practitioner | Admitting: Nurse Practitioner

## 2024-01-12 DIAGNOSIS — R928 Other abnormal and inconclusive findings on diagnostic imaging of breast: Secondary | ICD-10-CM | POA: Diagnosis not present

## 2024-01-12 DIAGNOSIS — N6011 Diffuse cystic mastopathy of right breast: Secondary | ICD-10-CM | POA: Diagnosis not present

## 2024-01-12 DIAGNOSIS — R92331 Mammographic heterogeneous density, right breast: Secondary | ICD-10-CM | POA: Diagnosis not present

## 2024-01-12 MED ORDER — GADOBUTROL 1 MMOL/ML IV SOLN
5.0000 mL | Freq: Once | INTRAVENOUS | Status: AC | PRN
  Administered 2024-01-12: 5 mL via INTRAVENOUS

## 2024-01-13 LAB — SURGICAL PATHOLOGY

## 2024-01-17 ENCOUNTER — Ambulatory Visit: Payer: Self-pay | Admitting: Nurse Practitioner

## 2024-03-12 DIAGNOSIS — H2513 Age-related nuclear cataract, bilateral: Secondary | ICD-10-CM | POA: Diagnosis not present

## 2024-03-12 DIAGNOSIS — H35371 Puckering of macula, right eye: Secondary | ICD-10-CM | POA: Diagnosis not present

## 2024-03-12 DIAGNOSIS — H5213 Myopia, bilateral: Secondary | ICD-10-CM | POA: Diagnosis not present

## 2024-03-12 DIAGNOSIS — H04123 Dry eye syndrome of bilateral lacrimal glands: Secondary | ICD-10-CM | POA: Diagnosis not present

## 2024-03-12 DIAGNOSIS — H52203 Unspecified astigmatism, bilateral: Secondary | ICD-10-CM | POA: Diagnosis not present

## 2024-03-21 DIAGNOSIS — I10 Essential (primary) hypertension: Secondary | ICD-10-CM | POA: Diagnosis not present

## 2024-03-21 DIAGNOSIS — R7303 Prediabetes: Secondary | ICD-10-CM | POA: Diagnosis not present

## 2024-03-21 DIAGNOSIS — K219 Gastro-esophageal reflux disease without esophagitis: Secondary | ICD-10-CM | POA: Diagnosis not present

## 2024-03-21 DIAGNOSIS — Z0000 Encounter for general adult medical examination without abnormal findings: Secondary | ICD-10-CM | POA: Diagnosis not present

## 2024-03-21 DIAGNOSIS — E7800 Pure hypercholesterolemia, unspecified: Secondary | ICD-10-CM | POA: Diagnosis not present

## 2024-05-14 DIAGNOSIS — Z1231 Encounter for screening mammogram for malignant neoplasm of breast: Secondary | ICD-10-CM | POA: Diagnosis not present

## 2024-05-14 LAB — HM MAMMOGRAPHY

## 2024-11-19 ENCOUNTER — Ambulatory Visit: Admitting: Nurse Practitioner
